# Patient Record
Sex: Male | Born: 1975
Health system: Southern US, Community
[De-identification: ages and names within clinical notes are randomized; demographics above are authoritative.]

## PROBLEM LIST (undated history)

## (undated) DIAGNOSIS — K649 Unspecified hemorrhoids: Secondary | ICD-10-CM

## (undated) DIAGNOSIS — Z8616 Personal history of COVID-19: Secondary | ICD-10-CM

## (undated) DIAGNOSIS — K219 Gastro-esophageal reflux disease without esophagitis: Secondary | ICD-10-CM

## (undated) DIAGNOSIS — Z9109 Other allergy status, other than to drugs and biological substances: Secondary | ICD-10-CM

## (undated) DIAGNOSIS — M19012 Primary osteoarthritis, left shoulder: Secondary | ICD-10-CM

## (undated) DIAGNOSIS — M25512 Pain in left shoulder: Secondary | ICD-10-CM

## (undated) DIAGNOSIS — Z9989 Dependence on other enabling machines and devices: Secondary | ICD-10-CM

## (undated) DIAGNOSIS — G4733 Obstructive sleep apnea (adult) (pediatric): Secondary | ICD-10-CM

## (undated) HISTORY — DX: Other allergy status, other than to drugs and biological substances: Z91.09

## (undated) HISTORY — DX: Gastro-esophageal reflux disease without esophagitis: K21.9

---

## 2006-01-22 ENCOUNTER — Ambulatory Visit: Payer: Self-pay | Admitting: Internal Medicine

## 2006-09-09 ENCOUNTER — Ambulatory Visit: Payer: Self-pay | Admitting: Internal Medicine

## 2008-01-21 ENCOUNTER — Emergency Department (HOSPITAL_COMMUNITY): Admission: EM | Admit: 2008-01-21 | Discharge: 2008-01-21 | Payer: Self-pay | Admitting: Emergency Medicine

## 2008-04-06 ENCOUNTER — Ambulatory Visit: Payer: Self-pay | Admitting: Internal Medicine

## 2008-04-06 DIAGNOSIS — K219 Gastro-esophageal reflux disease without esophagitis: Secondary | ICD-10-CM

## 2008-09-28 HISTORY — PX: ROTATOR CUFF REPAIR: SHX139

## 2009-02-05 ENCOUNTER — Telehealth (INDEPENDENT_AMBULATORY_CARE_PROVIDER_SITE_OTHER): Payer: Self-pay | Admitting: *Deleted

## 2009-02-05 ENCOUNTER — Ambulatory Visit: Payer: Self-pay | Admitting: Family Medicine

## 2009-02-08 ENCOUNTER — Telehealth (INDEPENDENT_AMBULATORY_CARE_PROVIDER_SITE_OTHER): Payer: Self-pay | Admitting: *Deleted

## 2009-02-11 ENCOUNTER — Encounter: Admission: RE | Admit: 2009-02-11 | Discharge: 2009-04-16 | Payer: Self-pay | Admitting: Orthopedic Surgery

## 2009-02-12 ENCOUNTER — Ambulatory Visit: Payer: Self-pay | Admitting: Internal Medicine

## 2009-10-09 ENCOUNTER — Encounter: Admission: RE | Admit: 2009-10-09 | Discharge: 2009-12-25 | Payer: Self-pay | Admitting: Orthopedic Surgery

## 2010-07-24 ENCOUNTER — Telehealth (INDEPENDENT_AMBULATORY_CARE_PROVIDER_SITE_OTHER): Payer: Self-pay | Admitting: *Deleted

## 2010-09-16 ENCOUNTER — Ambulatory Visit: Payer: Self-pay | Admitting: Internal Medicine

## 2010-09-16 DIAGNOSIS — R42 Dizziness and giddiness: Secondary | ICD-10-CM | POA: Insufficient documentation

## 2010-09-16 DIAGNOSIS — R21 Rash and other nonspecific skin eruption: Secondary | ICD-10-CM

## 2010-09-18 ENCOUNTER — Ambulatory Visit: Payer: Self-pay | Admitting: Internal Medicine

## 2010-09-24 LAB — CONVERTED CEMR LAB
BUN: 13 mg/dL (ref 6–23)
Basophils Relative: 0.5 % (ref 0.0–3.0)
CO2: 25 meq/L (ref 19–32)
Chloride: 106 meq/L (ref 96–112)
Cholesterol: 194 mg/dL (ref 0–200)
Eosinophils Absolute: 0.3 10*3/uL (ref 0.0–0.7)
Eosinophils Relative: 5.6 % — ABNORMAL HIGH (ref 0.0–5.0)
HCT: 46.2 % (ref 39.0–52.0)
LDL Cholesterol: 122 mg/dL — ABNORMAL HIGH (ref 0–99)
Lymphs Abs: 2.3 10*3/uL (ref 0.7–4.0)
MCHC: 34.1 g/dL (ref 30.0–36.0)
MCV: 89.5 fL (ref 78.0–100.0)
Monocytes Absolute: 0.7 10*3/uL (ref 0.1–1.0)
Platelets: 322 10*3/uL (ref 150.0–400.0)
Potassium: 4.4 meq/L (ref 3.5–5.1)
RBC: 5.16 M/uL (ref 4.22–5.81)
Triglycerides: 136 mg/dL (ref 0.0–149.0)
WBC: 5.9 10*3/uL (ref 4.5–10.5)

## 2010-10-28 NOTE — Progress Notes (Signed)
Summary: zantac refill quantity change request  Phone Note Refill Request Message from:  Fax from Pharmacy on July 24, 2010 3:56 PM  Refills Requested: Medication #1:  ZANTAC 150 MAXIMUM STRENGTH 150 MG  TABS 1 two times a day  ac meals [facility]   fax [phone]    (patient is employee of [employer])   handwritten note from pharmacy states   "[facility]'s ins covers OTC stock size pkg (#190) at no cost to pt.  Please advise on qty change.  Thanks! "  Initial call taken by: Jerolyn Shin,  July 24, 2010 3:58 PM    Prescriptions: ZANTAC 150 MAXIMUM STRENGTH 150 MG  TABS (RANITIDINE HCL) 1 two times a day  ac meals  #190 x 0   Entered by:   Doristine Devoid CMA   Authorized by:   Neena Rhymes MD   Signed by:   Doristine Devoid CMA on 07/24/2010   Method used:   Electronically to        [id number] (retail)       [address] [place], [place]  [address]       Ph: [phone]       Fax: [phone]   RxID:   [id number]

## 2010-10-30 NOTE — Assessment & Plan Note (Signed)
Summary: cpx, rash on face, heartburn///sph   Vital Signs:  Patient profile:   35 year old male Height:      66 inches Weight:      191.38 pounds BMI:     31.00 Pulse rate:   98 / minute Pulse rhythm:   regular BP sitting:   126 / 80  (left arm) Cuff size:   large  Vitals Entered By: Army Fossa CMA (September 16, 2010 2:01 PM) CC: CPX, not fasting  Comments c/o rash on back of neck- itches x 2 weeks  when bending over he gets dizzy. discuss tdap [employer]   History of Present Illness:  CPX, not fasting   in addition to his physical he needed to  discuss the following  2 weeks history of a rash in the sides of  the face, upper back, nuchal area , legs and suprapubic area. + pruritus. Denies exposures to new substances  when bending over he gets dizzy; symptoms   may last seconds to sometimes one or 2 days.  History of heartburn, currently taking Zantac as needed, it requires 2 or 3 Zantac over-the-counter to help with his symptoms. Denies dysphasia or odynophagia Sometimes has nausea and vomiting (describes regurgitation rather) in the mornings.       Current Medications (verified): 1)  Zantac 150 Maximum Strength 150 Mg  Tabs (Ranitidine Hcl) .Marland Kitchen.. 1 Two Times A Day  Ac Meals  Allergies (verified): No Known Drug Allergies  Past History:  Past Medical History: Reviewed history from 02/12/2009 and no changes required. no  Past Surgical History: Reviewed history from 02/12/2009 and no changes required. 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 CV:  Denies chest pain or discomfort and swelling of feet. Resp:  Denies cough, shortness of breath,  and wheezing. GI:  Denies bloody stools, diarrhea, and nausea. GU:  Denies dysuria, hematuria, urinary frequency, and urinary hesitancy. Psych:  Denies anxiety and depression.  Physical Exam  General:  alert, well-developed, and slightly overweight-appearing.   Neck:  no masses and no thyromegaly.   Lungs:  normal respiratory effort, no intercostal retractions, no accessory muscle use, and normal breath sounds.   Heart:  normal rate, regular rhythm, no murmur, and no gallop.   Abdomen:  soft, non-tender, no masses, no guarding, and no rigidity.   Extremities:  no pretibial edema bilaterally  Skin:  has a rash more noticeable at the upper back, nuchal area, some areas in the lower extremities. Rash is papular, slightly red and scaly, almost milliar Psych:  Oriented X3, memory intact for recent and remote, normally interactive, good eye contact, not anxious appearing, and not depressed appearing.     Impression & Recommendations:  Problem # 1:  ROUTINE GENERAL MEDICAL EXAM@HEALTH  CARE FACL (ICD-V70.0) apparently due for a tetanus shot, will recommend him to come back when better from the rash ( see physical exam)  had a flu shot 2 months ago  labs Diet exercise discussed  Problem # 2:  GERD (ICD-530.81) Assessment: Deteriorated has symptoms from time to time, would like something stronger than Zantac Plan: Switch to Prilosec GERD precautions provided His updated medication list for this problem includes:  Prilosec Otc 20 Mg Tbec (Omeprazole magnesium) ..... One by mouth before breakfast  Problem # 3:  RASH-NONVESICULAR (ICD-782.1) Assessment: New unclear etiology see Instructions  His updated medication list for this problem includes:    Hydrocortisone 2.5 % Lotn (Hydrocortisone) .Marland Kitchen... Applied 3 times a day for one week  Problem # 4:  DIZZINESS (ICD-780.4) Assessment: New dizziness, usually related with head motion recommend avoidance of quick head  movements, call if  worse states he would like to see a specialist if symptoms persist  His updated medication list for this problem includes:    Zyrtec Allergy 10 Mg Caps (Cetirizine hcl) .Marland Kitchen... 1 by mouth once daily as needed allergies  Complete Medication List: 1)  Prilosec Otc 20 Mg Tbec (Omeprazole magnesium) .... One by mouth before breakfast 2)  Hydrocortisone 2.5 % Lotn (Hydrocortisone) .... Applied 3 times a day for one week 3)  Zyrtec Allergy 10 Mg Caps (Cetirizine hcl) .Marland Kitchen.. 1 by mouth once daily as needed allergies  Patient Instructions: 1)  come back fasting 2)  FLP, BMP, CBC, AST, ALT, TSH---dx V70 3)  Stop Zantac, start Prilosec , one tablet daily, 20 minutes before breakfast 4)  for the rash, take Zyrtec 10 mg over the counter one a day, hydrocortisone lotion for a week to 10 days, call if no better 5)  Please schedule a follow-up appointment in 1 year.  Prescriptions: ZYRTEC ALLERGY 10 MG CAPS (CETIRIZINE HCL) 1 by mouth once daily as needed allergies  #30 x 3   Entered and Authorized by:   Nolon Rod. Paz MD   Signed by:   Nolon Rod. Paz MD on 09/16/2010   Method used:   Electronically to        [facility] (retail)       [address] [place], [place]  [address]       Ph: [phone]       Fax: [phone]   RxID:   [id number] PRILOSEC OTC 20 MG TBEC (OMEPRAZOLE MAGNESIUM) one by mouth before breakfast  #30 x 6   Entered and Authorized by:   Nolon Rod. Paz MD   Signed by:   Nolon Rod. Paz MD on 09/16/2010   Method used:   Electronically to        [facility] (retail)       [address] [place], [place]  [address]       Ph: [phone]       Fax: [phone]   RxID:   [id number] HYDROCORTISONE 2.5 % LOTN (HYDROCORTISONE) applied 3 times a day for one week  #120 cc x 0   Entered and Authorized by:   Nolon Rod. Paz MD   Signed by:   Nolon Rod. Paz MD on 09/16/2010   Method used:    Electronically to        [facility] (retail)       [address] [place], [place]  [address]       Ph: [phone]       Fax: [phone]   RxID:   [id number]    Orders Added: 1)  Est. Patient Level III [07371] 2)  Est. Patient age 23-39 [phone]

## 2011-04-03 ENCOUNTER — Encounter: Payer: Self-pay | Admitting: Internal Medicine

## 2011-04-03 ENCOUNTER — Ambulatory Visit (INDEPENDENT_AMBULATORY_CARE_PROVIDER_SITE_OTHER): Payer: Managed Care, Other (non HMO) | Admitting: Internal Medicine

## 2011-04-03 ENCOUNTER — Telehealth: Payer: Self-pay | Admitting: Internal Medicine

## 2011-04-03 VITALS — BP 128/80 | HR 100 | Temp 98.3°F | Wt 194.6 lb

## 2011-04-03 DIAGNOSIS — R21 Rash and other nonspecific skin eruption: Secondary | ICD-10-CM

## 2011-04-03 NOTE — Telephone Encounter (Signed)
error 

## 2011-04-03 NOTE — Progress Notes (Signed)
  Subjective:    Patient ID: Zachary Kerr, male    DOB: Sep 21, 1976, 35 y.o.   MRN: [id number]  HPI Rash for 3 days, mostly at the upper chest, upper back, nuchal area , arms, sides of the face  ++ pruritus This has been a recurrent problems, it usually self resolve in 2- 3 weeks.  No past medical history on file.  No past surgical history on file.  Review of Systems Zyrtec helps to some extent, no other family members affected    Objective:   Physical Exam Alert, no apparent distress, oriented x3. Skin: milliar , red rash , confluent in some areas at upper back=chest=nuchal area>arms Few between fingers in the R hand        Assessment & Plan:

## 2011-04-03 NOTE — Assessment & Plan Note (Addendum)
Recurrent-pruritic self resolving rash. Unclear etiology, could be contact dermatitis d/t  poison ivy but he has symptoms in the winter. Acarosis? Unlikely as  symptoms self-resolve. Plan: Zyrtec I like him to see dermatology ASAP so they can see the rash when he is at its worst.

## 2011-10-15 ENCOUNTER — Other Ambulatory Visit: Payer: Self-pay | Admitting: Internal Medicine

## 2013-01-24 ENCOUNTER — Other Ambulatory Visit: Payer: Self-pay | Admitting: Internal Medicine

## 2013-01-24 NOTE — Telephone Encounter (Signed)
Pt has not been seen within a year. OK to refill? 

## 2013-01-24 NOTE — Telephone Encounter (Signed)
Denied.

## 2013-04-18 ENCOUNTER — Encounter: Payer: Self-pay | Admitting: Internal Medicine

## 2013-04-18 ENCOUNTER — Ambulatory Visit (INDEPENDENT_AMBULATORY_CARE_PROVIDER_SITE_OTHER): Payer: Managed Care, Other (non HMO) | Admitting: Internal Medicine

## 2013-04-18 VITALS — BP 110/80 | HR 79 | Temp 98.0°F | Ht 64.5 in | Wt 193.2 lb

## 2013-04-18 DIAGNOSIS — Z0000 Encounter for general adult medical examination without abnormal findings: Secondary | ICD-10-CM | POA: Insufficient documentation

## 2013-04-18 DIAGNOSIS — K219 Gastro-esophageal reflux disease without esophagitis: Secondary | ICD-10-CM

## 2013-04-18 MED ORDER — DEXLANSOPRAZOLE 60 MG PO CPDR: 60.0000 mg | DELAYED_RELEASE_CAPSULE | Freq: Every day | ORAL | Status: DC

## 2013-04-18 NOTE — Patient Instructions (Addendum)
Please come back fasting: FLP, CBC, CMP, TSH MMR serology Hepatitis B surface antigen Anti hepatitis B core Hep A  Dx v70  ---     Dieta para el reflujo gastroesofgico - Adultos  (Diet for Gastroesophageal Reflux Disease, Adult)  El reflujo (reflujo cido) ocurre cuando el cido del estmago pasa al esfago. Cuando el cido entra en contacto con el esfago, el cido provoca dolor e irritacin (inflamacin) en el esfago. Cuando el reflujo ocurre a menudo o es tan grave que causa dao en el esfago, se denomina enfermedad por reflujo gastroesofgico (ERGE). La terapia nutricional puede ayudar a Acupuncturist de la [place].  ALIMENTOS O BEBIDAS QUE DEBE EVITAR O LIMITAR   Fumar o consumir tabaco. La nicotina es uno de los estimulantes ms potentes en la produccin de cido en el tracto gastrointestinal.  Caf y t negro con cafena o descafeinado.  Gaseosas comunes o bajas caloras o bebidas energizantes (las gaseosas sin cafena estn permitidas).   Especias picantes, como la pimienta negra, pimienta blanca, pimienta roja, pimienta de cayena, curry en polvo,y [place] en polvo.  Menta y mentol.  Chocolate.  Alimentos con alto contenido de grasas, incluyendo las carnes y comidas fritas. El agregado de [place] extra, por ejemplo aceite, [place], aderezo para ensaladas y nueces. Limite estos alimentos a menos de 8 cucharaditas por da.  Las frutas y verduras si no son toleradas, tales como frutas ctricas o tomates.  El alcohol.  Todo alimento que agrave el trastorno. Si tiene dudas relacionadas con la dieta, comunquese con el profesional que lo asiste o con un nutricionista matriculado.  OTROS FACTORES QUE PUEDEN ALIVIAR EL ERGE SON:   Comer lentamente, en un clima distendido.  Hacer 5 o 6 comidas pequeas por da en vez de tres grandes.  Suprimir por un [employer] alimentos que causen problemas.  No acostarse hasta despus de 3 horas de haber comido.  Mantener la  cabeza elevada 6 a 9 pulgadas (15 a 23 cm) usando una cua de espuma o bloques debajo de las patas de la cama. Si permanece en una postura plana har empeorar los sntomas.  Mantngase fsicamente activo. Perder peso puede ser de ayuda para reducir el [employer] adultos obesos o con sobrepeso.  Use ropas sueltas. EJEMPLO DE UN PLAN DE ALIMENTACIN  Este plan de alimentacin consiste en aproximadamente 2 000 caloras, segn las guas de alimentacin de https://www.bernard.org/.  Desayuno   taza de avena cocida.  1 porcin de fresas.  1 taza de [employer].  1 oz de almendras. Colacin  1 taza de rebanadas de pepino.  6 oz de yogur (elaborado con [employer] con bajo contenido de grasas o descremada). Almuerzo:  2 rebanada de pan integral.  2 oz de rebanadas de pavo.  2 cucharaditas de mayonesa.  1 taza de arndanos.  1 taza de guisantes. Colacin  6 crackers integrales.  1 oz ( 28 g) de queso en hebras. Cena   taza de arroz integral.  1 taza de vegetales variados.  1 cucharadita de aceite de oliva.  3 oz ( 84 g) de pescado grill. Document Released: 06/24/2005 Document Revised: 12/07/2011 [facility] Patient Information 2014 [place], [place].   Testicular Problems and Self-Exam Men can examine themselves easily and effectively with positive results. Monthly exams detect problems early and save lives. There are numerous causes of swelling in the testicle. Testicular cancer usually appears as a firm painless lump in the front part of the testicle. This may feel like  a dull ache or heavy feeling located in the lower abdomen (belly), groin, or scrotum.  The risk is greater in men with undescended testicles and it is more common in young men. It is responsible for almost a fifth of cancers in males between ages 14 and 38. Other common causes of swellings, lumps, and testicular pain include injuries, inflammation (soreness) from infection, hydrocele, and torsion. These are a  few of the reasons to do monthly self-examination of the testicles. The exam only takes minutes and could add years to your life. Get in the habit! SELF-EXAMINATION OF THE TESTICLES The testicles are easiest to examine after warm baths or showers and are more difficult to examine when you are cold. This is because the muscles attached to the testicles retract and pull them up higher or into the abdomen. While standing, roll one testicle between the thumb and forefinger. Feel for lumps, swelling, or discomfort. A normal testicle is egg shaped and feels firm. It is smooth and not tender. The spermatic cord can be felt as a firm spaghetti-like cord at the back of the testicle. It is also important to examine your groins. This is the crease between the front of your leg and your abdomen. Also, feel for enlarged lymph nodes (glands). Enlarged nodes are also a cause for you to see your caregiver for evaluation.  Self-examination of the testicles and groin areas on a regular basis will help you to know what your own testicles and groins feel like. This will help you pick up an abnormality (difference) at an earlier stage. Early discovery is the key to curing this cancer or treating other conditions. Any lump, change, or swelling in the testicle calls for immediate evaluation by your caregiver. Cancer of the testicle does not result in impotence and it does not prevent normal intercourse or prevent having children. If your caregiver feels that medical treatment or chemotherapy could lead to infertility, sperm can be frozen for future use. It is necessary to see a caregiver as soon as possible after the discovery of a lump in a testicle. Document Released: 12/21/2000 Document Revised: 12/07/2011 Document Reviewed: 09/15/2008 [facility] Patient Information 2014 [place], [place].

## 2013-04-18 NOTE — Progress Notes (Signed)
  Subjective:    Patient ID: Zachary Kerr, male    DOB: Apr 26, 1976, 37 y.o.   MRN: [id number]  HPI  CPX  Past Medical History  Diagnosis Date  . GERD (gastroesophageal reflux disease)   . Environmental allergies    Past Surgical History  Procedure Laterality Date  . Rotator cuff repair Left 2010   Family History  Problem Relation Age of Onset  . Alcohol abuse Father     GF  . Diabetes Neg Hx   . Hypertension Neg Hx   . Heart attack Neg Hx   . Colon cancer Neg Hx   . Prostate cancer Neg Hx    History   Social History  . Marital Status: Married    Spouse Name: N/A    Number of Children: 2  . Years of Education: N/A   Occupational History  . Forklift operator     Social History Main Topics  . Smoking status: Former Games developer  . Smokeless tobacco: Not on file  . Alcohol Use: No  . Drug Use: Not on file  . Sexually Active: Not on file   Other Topics Concern  . Not on file   Social History Narrative   From [place]           Review of Systems Diet is regular, he is active at work. History of GERD, Prilosec not helping enough, from time to time has severe acid reflux to the point that he needs to vomit, usually in the setting of eating late and going to bed. No odynophagia, very rarely has dysphagia. Denies weight loss, nausea, diarrhea or blood in the stools. No chest pain, shortness of breath. No dysuria gross hematuria.    Objective:   Physical Exam BP 110/80  Pulse 79  Temp(Src) 98 F (36.7 C) (Oral)  Ht 5' 4.5" (1.638 m)  Wt 193 lb 3.2 oz (87.635 kg)  BMI 32.66 kg/m2  SpO2 95%  General -- alert, well-developed, NAD .   Neck --no thyromegaly Lungs -- normal respiratory effort, no intercostal retractions, no accessory muscle use, and normal breath sounds.   Heart-- normal rate, regular rhythm, no murmur, and no gallop.   Abdomen--soft, non-tender, no distention, no masses, no HSM, no guarding, and no rigidity.   Extremities-- no pretibial edema  bilaterally Neurologic-- alert & oriented X3 and strength normal in all extremities. Psych-- Cognition and judgment appear intact. Alert and cooperative with normal attention span and concentration.  not anxious appearing and not depressed appearing.       Assessment & Plan:

## 2013-04-18 NOTE — Assessment & Plan Note (Addendum)
Long history of GERD, at some point well-controlled with Zantac,then  he required Prilosec and now Prilosec is not strong enough. Plan: Dexilant before breakfast Information about GERD precautions discussed specifically do not eat and lie  down immediately after. Patient also call me he is not improving in 1 month, he may need an EGD. Office visit 4 months.

## 2013-04-18 NOTE — Assessment & Plan Note (Addendum)
Td 2011 Patient request a number of immunizations as he will go through immigration and naturalization process; Many of today immunization he request are not age appropriate. Recommend to discuss with the doctor who will do his immigration physical exam. Will check hepatitis A and B. Serology, MMR titers. BMI, Diet and exercise discussed Labs

## 2013-04-19 ENCOUNTER — Encounter: Payer: Self-pay | Admitting: Internal Medicine

## 2013-04-19 ENCOUNTER — Other Ambulatory Visit (INDEPENDENT_AMBULATORY_CARE_PROVIDER_SITE_OTHER): Payer: Managed Care, Other (non HMO)

## 2013-04-19 DIAGNOSIS — Z0000 Encounter for general adult medical examination without abnormal findings: Secondary | ICD-10-CM

## 2013-04-19 LAB — LIPID PANEL
HDL: 42.2 mg/dL (ref >39.00)
Total CHOL/HDL Ratio: 5
Triglycerides: 212 mg/dL — ABNORMAL HIGH (ref 0.0–149.0)
VLDL: 42.4 mg/dL — ABNORMAL HIGH (ref 0.0–40.0)

## 2013-04-19 LAB — COMPREHENSIVE METABOLIC PANEL
AST: 26 U/L (ref 0–37)
Albumin: 4.1 g/dL (ref 3.5–5.2)
Alkaline Phosphatase: 52 U/L (ref 39–117)
BUN: 11 mg/dL (ref 6–23)
Calcium: 9.6 mg/dL (ref 8.4–10.5)
Chloride: 107 mEq/L (ref 96–112)
Creatinine, Ser: 0.9 mg/dL (ref 0.4–1.5)
Glucose, Bld: 84 mg/dL (ref 70–99)
Potassium: 4 mEq/L (ref 3.5–5.1)

## 2013-04-19 LAB — TSH: TSH: 0.79 u[IU]/mL (ref 0.35–5.50)

## 2013-04-19 LAB — CBC WITH DIFFERENTIAL/PLATELET
Eosinophils Relative: 3.8 % (ref 0.0–5.0)
Lymphocytes Relative: 37.5 % (ref 12.0–46.0)
Monocytes Absolute: 0.6 10*3/uL (ref 0.1–1.0)
Monocytes Relative: 11.1 % (ref 3.0–12.0)
Neutrophils Relative %: 47 % (ref 43.0–77.0)
Platelets: 319 10*3/uL (ref 150.0–400.0)
WBC: 5.6 10*3/uL (ref 4.5–10.5)

## 2013-04-20 LAB — RUBEOLA ANTIBODY IGG: Rubeola IgG: 32.3 AU/mL — ABNORMAL HIGH (ref <25.00)

## 2013-04-20 LAB — MUMPS ANTIBODY, IGG: Mumps IgG: 291 AU/mL — ABNORMAL HIGH (ref <9.00)

## 2013-04-24 ENCOUNTER — Encounter: Payer: Self-pay | Admitting: *Deleted

## 2013-05-02 ENCOUNTER — Ambulatory Visit (INDEPENDENT_AMBULATORY_CARE_PROVIDER_SITE_OTHER): Payer: Managed Care, Other (non HMO)

## 2013-05-02 DIAGNOSIS — Z23 Encounter for immunization: Secondary | ICD-10-CM

## 2013-06-20 ENCOUNTER — Ambulatory Visit: Payer: Managed Care, Other (non HMO) | Admitting: *Deleted

## 2013-06-20 DIAGNOSIS — Z23 Encounter for immunization: Secondary | ICD-10-CM

## 2013-10-10 ENCOUNTER — Ambulatory Visit: Payer: Managed Care, Other (non HMO) | Admitting: Internal Medicine

## 2013-10-10 ENCOUNTER — Ambulatory Visit (INDEPENDENT_AMBULATORY_CARE_PROVIDER_SITE_OTHER): Payer: Managed Care, Other (non HMO) | Admitting: Internal Medicine

## 2013-10-10 ENCOUNTER — Encounter: Payer: Self-pay | Admitting: Internal Medicine

## 2013-10-10 VITALS — BP 111/73 | HR 81 | Temp 98.0°F | Wt 205.0 lb

## 2013-10-10 DIAGNOSIS — Z0000 Encounter for general adult medical examination without abnormal findings: Secondary | ICD-10-CM

## 2013-10-10 DIAGNOSIS — Z23 Encounter for immunization: Secondary | ICD-10-CM

## 2013-10-10 DIAGNOSIS — J309 Allergic rhinitis, unspecified: Secondary | ICD-10-CM

## 2013-10-10 DIAGNOSIS — M7710 Lateral epicondylitis, unspecified elbow: Secondary | ICD-10-CM

## 2013-10-10 DIAGNOSIS — K219 Gastro-esophageal reflux disease without esophagitis: Secondary | ICD-10-CM

## 2013-10-10 MED ORDER — CETIRIZINE HCL 10 MG PO TABS: 10.0000 mg | ORAL_TABLET | Freq: Every day | ORAL | Status: DC

## 2013-10-10 MED ORDER — DEXLANSOPRAZOLE 60 MG PO CPDR: 60.0000 mg | DELAYED_RELEASE_CAPSULE | Freq: Every day | ORAL | Status: DC

## 2013-10-10 NOTE — Progress Notes (Signed)
Pre visit review using our clinic review tool, if applicable. No additional management support is needed unless otherwise documented below in the visit note. 

## 2013-10-10 NOTE — Progress Notes (Signed)
   Subjective:    Patient ID: Zachary Kerr, male    DOB: 04/09/1976, 38 y.o.   MRN: [id number]  HPI Here for a ROV ,we discussed the following issues: GERD--  symptoms are well-controlled with dexilant, wonders how long  He needs to take it. Tennis elbow--recently saw orthopedic Dr., he had a local injection, slightly better but is still has pain. Allergies--well-controlled with Zyrtec as needed, and needs a refill.  Past Medical History  Diagnosis Date  . GERD (gastroesophageal reflux disease)   . Environmental allergies    Past Surgical History  Procedure Laterality Date  . Rotator cuff repair Left 2010   History   Social History  . Marital Status: Married    Spouse Name: N/A    Number of Children: 2  . Years of Education: N/A   Occupational History  . Forklift operator     Social History Main Topics  . Smoking status: Former Games developermoker  . Smokeless tobacco: Not on file  . Alcohol Use: No  . Drug Use: Not on file  . Sexual Activity: Not on file   Other Topics Concern  . Not on file   Social History Narrative   From [place]            Review of Systems Denies nausea, vomiting, diarrhea. No dysphagia or odynophagia.     Objective:   Physical Exam BP 111/73  Pulse 81  Temp(Src) 98 F (36.7 C)  Wt 205 lb (92.987 kg)  SpO2 99% General -- alert, well-developed, NAD.   Extremities--  Left elbow normal Right elbow normal to inspection and palpation except for mild swelling without tenderness or redness in the lateral epicondyle  Neurologic--  alert & oriented X3. Speech normal, gait normal, strength normal in all extremities.  Psych-- Cognition and judgment appear intact. Cooperative with normal attention span and concentration. No anxious or depressed appearing.      Assessment & Plan:

## 2013-10-10 NOTE — Patient Instructions (Signed)
  Next visit is for a physical exam in 6 months , fasting Please make an appointment      

## 2013-10-10 NOTE — Assessment & Plan Note (Signed)
Request a refill on Zyrtec, uses prn

## 2013-10-10 NOTE — Assessment & Plan Note (Signed)
Symptoms well-controlled with dexilant, we agreed to decrease this to every other day for 3 months and then stop and take as needed.  He is following some of the GERD precautions I recommend him.

## 2013-10-10 NOTE — Assessment & Plan Note (Signed)
Status post two hepatitis a and B shots. Will get a hepatitis B shot today. One of the LFTs  was a slightly elevated, does not drink alcohol or takes Tylenol, we will recheck when he comes back for a CPX

## 2013-10-10 NOTE — Assessment & Plan Note (Signed)
Was seen by orthopedic surgery for a right tennis elbow, status post a local injection, still having some pain. Recommend consistent use of a tennis elbow support and  ICE

## 2014-04-24 ENCOUNTER — Encounter: Payer: Managed Care, Other (non HMO) | Admitting: Internal Medicine

## 2014-05-01 ENCOUNTER — Encounter: Payer: Self-pay | Admitting: Internal Medicine

## 2014-05-01 ENCOUNTER — Ambulatory Visit (INDEPENDENT_AMBULATORY_CARE_PROVIDER_SITE_OTHER): Payer: Managed Care, Other (non HMO) | Admitting: Internal Medicine

## 2014-05-01 VITALS — BP 101/66 | HR 70 | Temp 98.3°F | Ht 65.8 in | Wt 189.0 lb

## 2014-05-01 DIAGNOSIS — Z0000 Encounter for general adult medical examination without abnormal findings: Secondary | ICD-10-CM

## 2014-05-01 DIAGNOSIS — L259 Unspecified contact dermatitis, unspecified cause: Secondary | ICD-10-CM

## 2014-05-01 MED ORDER — PREDNISONE 10 MG PO TABS
ORAL_TABLET | ORAL | Status: DC
Start: 2014-05-01 — End: 2014-06-21

## 2014-05-01 MED ORDER — HYDROCORTISONE 2.5 % EX LOTN: TOPICAL_LOTION | Freq: Two times a day (BID) | CUTANEOUS | Status: DC | PRN

## 2014-05-01 NOTE — Progress Notes (Signed)
   Subjective:    Patient ID: Zachary Kerr, male    DOB: 12/31/1975, 38 y.o.   MRN: [id number]  DOS:  05/01/2014 Type of visit - description:  CPX Feels well    ROS Diet, Exercise-- much improved  No  CP, SOB Denies  nausea, vomiting diarrhea, blood in the stools No GERD  Sx recently .  (-) cough, sputum production (-) wheezing, chest congestion No dysuria, gross hematuria, difficulty urinating  No anxiety, depression    Past Medical History  Diagnosis Date  . GERD (gastroesophageal reflux disease)   . Environmental allergies     Past Surgical History  Procedure Laterality Date  . Rotator cuff repair Left 2010    History   Social History  . Marital Status: Married    Spouse Name: N/A    Number of Children: 2  . Years of Education: N/A   Occupational History  . Forklift operator     Social History Main Topics  . Smoking status: Former Games developermoker  . Smokeless tobacco: Never Used  . Alcohol Use: No  . Drug Use: No  . Sexual Activity: Not on file   Other Topics Concern  . Not on file   Social History Narrative   From [place]            Family History  Problem Relation Age of Onset  . Alcohol abuse Father     GF  . Diabetes Neg Hx   . Hypertension Neg Hx   . Heart attack Neg Hx   . Colon cancer Neg Hx   . Prostate cancer Neg Hx       Medication List       This list is accurate as of: 05/01/14  7:20 PM.  Always use your most recent med list.               cetirizine 10 MG tablet  Commonly known as:  ZYRTEC  Take 1 tablet (10 mg total) by mouth daily.     dexlansoprazole 60 MG capsule  Commonly known as:  DEXILANT  Take 1 capsule (60 mg total) by mouth daily.     hydrocortisone 2.5 % lotion  Apply topically 2 (two) times daily as needed.     predniSONE 10 MG tablet  Commonly known as:  DELTASONE  3 tabs x 3 days, 2 tabs x 3 days, 1 tab x 3 days           Objective:   Physical Exam BP 101/66  Pulse 70  Temp(Src) 98.3 F (36.8 C)   Ht 5' 5.8" (1.671 m)  Wt 189 lb (85.73 kg)  BMI 30.70 kg/m2  SpO2 96%  General -- alert, well-developed, NAD.  Neck --no thyromegaly , normal carotid pulse  HEENT-- Not pale.   Lungs -- normal respiratory effort, no intercostal retractions, no accessory muscle use, and normal breath sounds.  Heart-- normal rate, regular rhythm, no murmur.  Abdomen-- Not distended, good bowel sounds,soft, non-tender. Extremities-- no pretibial edema bilaterally  Neurologic--  alert & oriented X3. Speech normal, gait appropriate for age, strength symmetric and appropriate for age.  Psych-- Cognition and judgment appear intact. Cooperative with normal attention span and concentration. No anxious or depressed appearing.         Assessment & Plan:

## 2014-05-01 NOTE — Progress Notes (Signed)
Pre visit review using our clinic review tool, if applicable. No additional management support is needed unless otherwise documented below in the visit note. 

## 2014-05-01 NOTE — Assessment & Plan Note (Addendum)
Td 2011  diet- exercise-- doing better Labs  ekg nsr

## 2014-05-01 NOTE — Patient Instructions (Signed)
Come back fasting for blood work CMP, FLP, CBC------------ dx V70  If you have an allergic reaction to poison oak: Take prednisone as prescribed for few days Use a prescription for hydrocortisone 2.5% twice a day until better Take OTC Zyrtec or Claritin as needed Call if no better    Next visit 1 year

## 2014-05-01 NOTE — Assessment & Plan Note (Signed)
H/o contact dermatitis due to plants, request a prescription to have in case he needs it. Recommend first avoidance; if he does develop a rash will take prednisone and use hydrocortisone 2.5%, prescriptions provided

## 2014-05-04 ENCOUNTER — Other Ambulatory Visit (INDEPENDENT_AMBULATORY_CARE_PROVIDER_SITE_OTHER): Payer: Managed Care, Other (non HMO)

## 2014-05-04 DIAGNOSIS — Z0000 Encounter for general adult medical examination without abnormal findings: Secondary | ICD-10-CM

## 2014-05-04 LAB — COMPREHENSIVE METABOLIC PANEL
ALBUMIN: 3.8 g/dL (ref 3.5–5.2)
ALT: 43 U/L (ref 0–53)
AST: 23 U/L (ref 0–37)
Alkaline Phosphatase: 50 U/L (ref 39–117)
BUN: 14 mg/dL (ref 6–23)
CALCIUM: 9.1 mg/dL (ref 8.4–10.5)
CO2: 22 meq/L (ref 19–32)
Chloride: 108 mEq/L (ref 96–112)
Creatinine, Ser: 0.8 mg/dL (ref 0.4–1.5)
GFR: 107.43 mL/min (ref >60.00)
GLUCOSE: 86 mg/dL (ref 70–99)
POTASSIUM: 3.8 meq/L (ref 3.5–5.1)
Sodium: 138 mEq/L (ref 135–145)
TOTAL PROTEIN: 6.8 g/dL (ref 6.0–8.3)
Total Bilirubin: 0.8 mg/dL (ref 0.2–1.2)

## 2014-05-04 LAB — CBC WITH DIFFERENTIAL/PLATELET
BASOS ABS: 0 10*3/uL (ref 0.0–0.1)
Basophils Relative: 0.5 % (ref 0.0–3.0)
Eosinophils Absolute: 0.2 10*3/uL (ref 0.0–0.7)
Eosinophils Relative: 3.8 % (ref 0.0–5.0)
HCT: 44.1 % (ref 39.0–52.0)
Hemoglobin: 15.1 g/dL (ref 13.0–17.0)
LYMPHS PCT: 37.4 % (ref 12.0–46.0)
Lymphs Abs: 2 10*3/uL (ref 0.7–4.0)
MCHC: 34.2 g/dL (ref 30.0–36.0)
MCV: 88.1 fl (ref 78.0–100.0)
Monocytes Absolute: 0.6 10*3/uL (ref 0.1–1.0)
Monocytes Relative: 11.9 % (ref 3.0–12.0)
NEUTROS PCT: 46.4 % (ref 43.0–77.0)
Neutro Abs: 2.5 10*3/uL (ref 1.4–7.7)
Platelets: 292 10*3/uL (ref 150.0–400.0)
RBC: 5.01 Mil/uL (ref 4.22–5.81)
RDW: 13.2 % (ref 11.5–15.5)
WBC: 5.3 10*3/uL (ref 4.0–10.5)

## 2014-05-04 LAB — LIPID PANEL
CHOL/HDL RATIO: 4
Cholesterol: 186 mg/dL (ref 0–200)
HDL: 44.6 mg/dL (ref >39.00)
LDL CALC: 117 mg/dL — AB (ref 0–99)
NONHDL: 141.4
TRIGLYCERIDES: 121 mg/dL (ref 0.0–149.0)
VLDL: 24.2 mg/dL (ref 0.0–40.0)

## 2014-06-21 ENCOUNTER — Encounter: Payer: Self-pay | Admitting: Physician Assistant

## 2014-06-21 ENCOUNTER — Emergency Department (HOSPITAL_BASED_OUTPATIENT_CLINIC_OR_DEPARTMENT_OTHER)
Admission: EM | Admit: 2014-06-21 | Discharge: 2014-06-21 | Discharge status: Against Medical Advice | Payer: Managed Care, Other (non HMO) | Attending: Emergency Medicine | Admitting: Emergency Medicine

## 2014-06-21 ENCOUNTER — Ambulatory Visit (INDEPENDENT_AMBULATORY_CARE_PROVIDER_SITE_OTHER): Payer: Managed Care, Other (non HMO) | Admitting: Physician Assistant

## 2014-06-21 ENCOUNTER — Encounter (HOSPITAL_BASED_OUTPATIENT_CLINIC_OR_DEPARTMENT_OTHER): Payer: Self-pay | Admitting: Emergency Medicine

## 2014-06-21 VITALS — BP 106/73 | HR 88 | Temp 98.0°F | Resp 18 | Ht 65.8 in | Wt 181.4 lb

## 2014-06-21 DIAGNOSIS — R197 Diarrhea, unspecified: Secondary | ICD-10-CM | POA: Insufficient documentation

## 2014-06-21 DIAGNOSIS — K5289 Other specified noninfective gastroenteritis and colitis: Secondary | ICD-10-CM

## 2014-06-21 DIAGNOSIS — A049 Bacterial intestinal infection, unspecified: Secondary | ICD-10-CM | POA: Insufficient documentation

## 2014-06-21 LAB — CBC WITH DIFFERENTIAL/PLATELET
Basophils Absolute: 0 10*3/uL (ref 0.0–0.1)
Basophils Relative: 0.2 % (ref 0.0–3.0)
Eosinophils Absolute: 0.1 10*3/uL (ref 0.0–0.7)
Eosinophils Relative: 1 % (ref 0.0–5.0)
HCT: 48.9 % (ref 39.0–52.0)
Hemoglobin: 17.3 g/dL — ABNORMAL HIGH (ref 13.0–17.0)
Lymphocytes Relative: 17.5 % (ref 12.0–46.0)
Lymphs Abs: 0.8 10*3/uL (ref 0.7–4.0)
MCHC: 35.4 g/dL (ref 30.0–36.0)
MCV: 86 fl (ref 78.0–100.0)
Monocytes Absolute: 0.6 10*3/uL (ref 0.1–1.0)
Monocytes Relative: 12.7 % — ABNORMAL HIGH (ref 3.0–12.0)
Neutro Abs: 3.3 10*3/uL (ref 1.4–7.7)
Neutrophils Relative %: 68.6 % (ref 43.0–77.0)
Platelets: 325 10*3/uL (ref 150.0–400.0)
RBC: 5.69 Mil/uL (ref 4.22–5.81)
RDW: 13.2 % (ref 11.5–15.5)
WBC: 4.9 10*3/uL (ref 4.0–10.5)

## 2014-06-21 LAB — BASIC METABOLIC PANEL
BUN: 34 mg/dL — ABNORMAL HIGH (ref 6–23)
CO2: 21 mEq/L (ref 19–32)
Calcium: 9.2 mg/dL (ref 8.4–10.5)
Chloride: 97 mEq/L (ref 96–112)
Creatinine, Ser: 2.3 mg/dL — ABNORMAL HIGH (ref 0.4–1.5)
GFR: 34.09 mL/min — ABNORMAL LOW (ref >60.00)
Glucose, Bld: 113 mg/dL — ABNORMAL HIGH (ref 70–99)
Potassium: 3.3 mEq/L — ABNORMAL LOW (ref 3.5–5.1)
Sodium: 130 mEq/L — ABNORMAL LOW (ref 135–145)

## 2014-06-21 MED ORDER — CIPROFLOXACIN HCL 500 MG PO TABS: 500.0000 mg | ORAL_TABLET | Freq: Two times a day (BID) | ORAL | Status: DC

## 2014-06-21 NOTE — Progress Notes (Signed)
Pre visit review using our clinic review tool, if applicable. No additional management support is needed unless otherwise documented below in the visit note. 

## 2014-06-21 NOTE — Progress Notes (Signed)
Patient presents to clinic today c/o frequent loose stools over the past 4 days after ingesting some undercooked chicken. Endorses intermittent fevers at home, although afebrile at present.  Endorses diffuse abdominal cramping.  Denies focal pain, tenesmus, melena or hematochezia.  Denies recent travel or sick contact.  Is able to tolerate PO fluids without nausea or emesis.  Was seen at an UC a few days ago and diagnosed with viral infection.  Denies having labs or stool cultures at that time.  Past Medical History  Diagnosis Date  . GERD (gastroesophageal reflux disease)   . Environmental allergies     Current Outpatient Prescriptions on File Prior to Visit  Medication Sig Dispense Refill  . cetirizine (ZYRTEC) 10 MG tablet Take 1 tablet (10 mg total) by mouth daily.  30 tablet  6  . dexlansoprazole (DEXILANT) 60 MG capsule Take 1 capsule (60 mg total) by mouth daily.  30 capsule  6   No current facility-administered medications on file prior to visit.    No Known Allergies  Family History  Problem Relation Age of Onset  . Alcohol abuse Father     GF  . Diabetes Neg Hx   . Hypertension Neg Hx   . Heart attack Neg Hx   . Colon cancer Neg Hx   . Prostate cancer Neg Hx     History   Social History  . Marital Status: Married    Spouse Name: N/A    Number of Children: 2  . Years of Education: N/A   Occupational History  . Forklift operator     Social History Main Topics  . Smoking status: Former Games developer  . Smokeless tobacco: Never Used  . Alcohol Use: No  . Drug Use: No  . Sexual Activity: None   Other Topics Concern  . None   Social History Narrative   From [place]          Review of Systems - See HPI.  All other ROS are negative.  BP 106/73  Pulse 88  Temp(Src) 98 F (36.7 C) (Oral)  Resp 18  Ht 5' 5.8" (1.671 m)  Wt 181 lb 6.4 oz (82.283 kg)  BMI 29.47 kg/m2  SpO2 97%  Physical Exam  Vitals reviewed. Constitutional: He is oriented to person,  place, and time and well-developed, well-nourished, and in no distress.  HENT:  Head: Normocephalic and atraumatic.  Mouth/Throat: Oropharynx is clear and moist.  Eyes: Conjunctivae are normal. Pupils are equal, round, and reactive to light.  Neck: Neck supple.  Cardiovascular: Normal rate, regular rhythm, normal heart sounds and intact distal pulses.   Pulmonary/Chest: Effort normal and breath sounds normal. No respiratory distress. He has no wheezes. He has no rales. He exhibits no tenderness.  Abdominal: Soft. He exhibits no distension. There is no rebound and no guarding.  Diffuse abdominal tenderness.  Bowel sounds hyperactive in all 4 Q's  Neurological: He is alert and oriented to person, place, and time.  Skin: Skin is warm and dry. No rash noted.  Psychiatric: Affect normal.   Recent Results (from the past 2160 hour(s))  COMPREHENSIVE METABOLIC PANEL     Status: None   Collection Time    05/04/14  8:15 AM      Result Value Ref Range   Sodium 138  135 - 145 mEq/L   Potassium 3.8  3.5 - 5.1 mEq/L   Chloride 108  96 - 112 mEq/L   CO2 22  19 - 32 mEq/L  Glucose, Bld 86  70 - 99 mg/dL   BUN 14  6 - 23 mg/dL   Creatinine, Ser 0.8  0.4 - 1.5 mg/dL   Total Bilirubin 0.8  0.2 - 1.2 mg/dL   Alkaline Phosphatase 50  39 - 117 U/L   AST 23  0 - 37 U/L   ALT 43  0 - 53 U/L   Total Protein 6.8  6.0 - 8.3 g/dL   Albumin 3.8  3.5 - 5.2 g/dL   Calcium 9.1  8.4 - 09.8 mg/dL   GFR 119.14  >78.29 mL/min  LIPID PANEL     Status: Abnormal   Collection Time    05/04/14  8:15 AM      Result Value Ref Range   Cholesterol 186  0 - 200 mg/dL   Comment: ATP III Classification       Desirable:  < 200 mg/dL               Borderline High:  200 - 239 mg/dL          High:  > = 562 mg/dL   Triglycerides 130.8  0.0 - 149.0 mg/dL   Comment: Normal:  <657 mg/dLBorderline High:  150 - 199 mg/dL   HDL 84.69  >62.95 mg/dL   VLDL 28.4  0.0 - 13.2 mg/dL   LDL Cholesterol 440 (*) 0 - 99 mg/dL   Total  CHOL/HDL Ratio 4     Comment:                Men          Women1/2 Average Risk     3.4          3.3Average Risk          5.0          4.42X Average Risk          9.6          7.13X Average Risk          15.0          11.0                       NonHDL 141.40     Comment: NOTE:  Non-HDL goal should be 30 mg/dL higher than patient's LDL goal (i.e. LDL goal of < 70 mg/dL, would have non-HDL goal of < 100 mg/dL)  CBC WITH DIFFERENTIAL     Status: None   Collection Time    05/04/14  8:15 AM      Result Value Ref Range   WBC 5.3  4.0 - 10.5 K/uL   RBC 5.01  4.22 - 5.81 Mil/uL   Hemoglobin 15.1  13.0 - 17.0 g/dL   HCT 10.2  72.5 - 36.6 %   MCV 88.1  78.0 - 100.0 fl   MCHC 34.2  30.0 - 36.0 g/dL   RDW 44.0  34.7 - 42.5 %   Platelets 292.0  150.0 - 400.0 K/uL   Neutrophils Relative % 46.4  43.0 - 77.0 %   Lymphocytes Relative 37.4  12.0 - 46.0 %   Monocytes Relative 11.9  3.0 - 12.0 %   Eosinophils Relative 3.8  0.0 - 5.0 %   Basophils Relative 0.5  0.0 - 3.0 %   Neutro Abs 2.5  1.4 - 7.7 K/uL   Lymphs Abs 2.0  0.7 - 4.0 K/uL   Monocytes Absolute 0.6  0.1 - 1.0 K/uL  Eosinophils Absolute 0.2  0.0 - 0.7 K/uL   Basophils Absolute 0.0  0.0 - 0.1 K/uL   Assessment/Plan: Bacterial gastroenteritis Suspected salmenellosis giving history and intermittent fever.  Will obtain CBC, BMP, Stool Cultue, Ova and Parasites and C. Diff assay.  Will empirically treat with Cipro BID x 5 days.  Increase fluids.  Encouraged Gatorade.  Diet for diarrhea given to patient.  Avoid antidiarrheals to help remove toxins from body.  If stools very frequent, can try some Immodium.  Alarm signs/symptoms discussed with patient.  Patient is aware when to proceed to ER if indicated.  Otherwise follow-up with PCP on Monday.

## 2014-06-21 NOTE — ED Notes (Signed)
Pt c/o HA and diarrhea that began Monday morning. Pt denies N/V. Pt also sts he began having abdominal pain Monday afternoon. Pt seen at [facility] yesterday but he sts "they couldn't find anything." He sts she gave him an RX to stop the diarrhea and told him to go back to work today. He sts he does not know the name of the med but that they aren't working.

## 2014-06-21 NOTE — Patient Instructions (Addendum)
Please go to lab for blood work.  I will call you with your results.  You will be given items to help collect stool for testing. Please bring those in if symptoms are persisting despite treatment.  I feel your symptoms are bacterial related giving you possibly had some undercooked chicken the night before symptoms started.  Please take Ciprofloxacin twice daily for 5 days.  Stay well hydrated.  Consider drinking some gatorade.  Please read diet for diarrhea below.  If this is bacterial then the diarrhea helps remove the  Toxins from the body, so having some bowel movements is beneficial.  Only take Lomotil if bowel movements are very frequent.  Can use tylenol for abdominal discomfort.  If symptoms acutely worsen or you are unable to tolerate fluids by mouth, please go directly to the ER.  Follow-up with Dr. Drue Novel on Monday.  Food Choices to Help Relieve Diarrhea When you have diarrhea, the foods you eat and your eating habits are very important. Choosing the right foods and drinks can help relieve diarrhea. Also, because diarrhea can last up to 7 days, you need to replace lost fluids and electrolytes (such as sodium, potassium, and chloride) in order to help prevent dehydration.  WHAT GENERAL GUIDELINES DO I NEED TO FOLLOW?  Slowly drink 1 cup (8 oz) of fluid for each episode of diarrhea. If you are getting enough fluid, your urine will be clear or pale yellow.  Eat starchy foods. Some good choices include white rice, white toast, pasta, low-fiber cereal, baked potatoes (without the skin), saltine crackers, and bagels.  Avoid large servings of any cooked vegetables.  Limit fruit to two servings per day. A serving is  cup or 1 small piece.  Choose foods with less than 2 g of fiber per serving.  Limit fats to less than 8 tsp (38 g) per day.  Avoid fried foods.  Eat foods that have probiotics in them. Probiotics can be found in certain dairy products.  Avoid foods and beverages that may increase  the speed at which food moves through the stomach and intestines (gastrointestinal tract). Things to avoid include:  High-fiber foods, such as dried fruit, raw fruits and vegetables, nuts, seeds, and whole grain foods.  Spicy foods and high-fat foods.  Foods and beverages sweetened with high-fructose corn syrup, honey, or sugar alcohols such as xylitol, sorbitol, and mannitol. WHAT FOODS ARE RECOMMENDED? Grains White rice. White, [place], or pita breads (fresh or toasted), including plain rolls, buns, or bagels. White pasta. Saltine, soda, or graham crackers. Pretzels. Low-fiber cereal. Cooked cereals made with water (such as cornmeal, farina, or cream cereals). Plain muffins. Matzo. Melba toast. Zwieback.  Vegetables Potatoes (without the skin). Strained tomato and vegetable juices. Most well-cooked and canned vegetables without seeds. Tender lettuce. Fruits Cooked or canned applesauce, apricots, cherries, fruit cocktail, grapefruit, peaches, pears, or plums. Fresh bananas, apples without skin, cherries, grapes, cantaloupe, grapefruit, peaches, oranges, or plums.  Meat and Other Protein Products Baked or boiled chicken. Eggs. Tofu. Fish. Seafood. Smooth peanut butter. Ground or well-cooked tender beef, ham, veal, lamb, pork, or poultry.  Dairy Plain yogurt, kefir, and unsweetened liquid yogurt. Lactose-free milk, buttermilk, or soy milk. Plain hard cheese. Beverages Sport drinks. Clear broths. Diluted fruit juices (except prune). Regular, caffeine-free sodas such as ginger ale. Water. Decaffeinated teas. Oral rehydration solutions. Sugar-free beverages not sweetened with sugar alcohols. Other Bouillon, broth, or soups made from recommended foods.  The items listed above may not be a complete list  of recommended foods or beverages. Contact your dietitian for more options. WHAT FOODS ARE NOT RECOMMENDED? Grains Whole grain, whole wheat, bran, or rye breads, rolls, pastas, crackers, and  cereals. Wild or brown rice. Cereals that contain more than 2 g of fiber per serving. Corn tortillas or taco shells. Cooked or dry oatmeal. Granola. Popcorn. Vegetables Raw vegetables. Cabbage, broccoli, Brussels sprouts, artichokes, baked beans, beet greens, corn, kale, legumes, peas, sweet potatoes, and yams. Potato skins. Cooked spinach and cabbage. Fruits Dried fruit, including raisins and dates. Raw fruits. Stewed or dried prunes. Fresh apples with skin, apricots, mangoes, pears, raspberries, and strawberries.  Meat and Other Protein Products Chunky peanut butter. Nuts and seeds. Beans and lentils. Tomasa Blase.  Dairy High-fat cheeses. Milk, chocolate milk, and beverages made with milk, such as milk shakes. Cream. Ice cream. Sweets and Desserts Sweet rolls, doughnuts, and sweet breads. Pancakes and waffles. Fats and Oils Butter. Cream sauces. Margarine. Salad oils. Plain salad dressings. Olives. Avocados.  Beverages Caffeinated beverages (such as coffee, tea, soda, or energy drinks). Alcoholic beverages. Fruit juices with pulp. Prune juice. Soft drinks sweetened with high-fructose corn syrup or sugar alcohols. Other Coconut. Hot sauce. Chili powder. Mayonnaise. Gravy. Cream-based or milk-based soups.  The items listed above may not be a complete list of foods and beverages to avoid. Contact your dietitian for more information. WHAT SHOULD I DO IF I BECOME DEHYDRATED? Diarrhea can sometimes lead to dehydration. Signs of dehydration include dark urine and dry mouth and skin. If you think you are dehydrated, you should rehydrate with an oral rehydration solution. These solutions can be purchased at pharmacies, retail stores, or online.  Drink -1 cup (120-240 mL) of oral rehydration solution each time you have an episode of diarrhea. If drinking this amount makes your diarrhea worse, try drinking smaller amounts more often. For example, drink 1-3 tsp (5-15 mL) every 5-10 minutes.  A general rule for  staying hydrated is to drink 1-2 L of fluid per day. Talk to your health care provider about the specific amount you should be drinking each day. Drink enough fluids to keep your urine clear or pale yellow. Document Released: 12/05/2003 Document Revised: 09/19/2013 Document Reviewed: 08/07/2013 [facility] Patient Information 2015 [place], [place]. This information is not intended to replace advice given to you by your health care provider. Make sure you discuss any questions you have with your health care provider.

## 2014-06-21 NOTE — ED Notes (Signed)
In the middle of triaging patient, he made comments that indicated that he was supposed to be upstairs at the [facility] clinic and not the ER. Mistake explained to pt. I advised him that we would be happy to see him if the clinic could not fit him in and he stated that he would return if need be. Pt got himself dressed and I escorted him up to the second floor to the clinic

## 2014-06-21 NOTE — Assessment & Plan Note (Signed)
Suspected salmenellosis giving history and intermittent fever.  Will obtain CBC, BMP, Stool Cultue, Ova and Parasites and C. Diff assay.  Will empirically treat with Cipro BID x 5 days.  Increase fluids.  Encouraged Gatorade.  Diet for diarrhea given to patient.  Avoid antidiarrheals to help remove toxins from body.  If stools very frequent, can try some Immodium.  Alarm signs/symptoms discussed with patient.  Patient is aware when to proceed to ER if indicated.  Otherwise follow-up with PCP on Monday.

## 2014-06-23 NOTE — ED Provider Notes (Signed)
Registered by mistake.Patient did not want to be seen by me  Nelia Shi, MD 06/23/14 2148

## 2014-08-08 ENCOUNTER — Other Ambulatory Visit: Payer: Self-pay | Admitting: Internal Medicine

## 2014-11-30 ENCOUNTER — Encounter: Payer: Self-pay | Admitting: Medical

## 2014-11-30 ENCOUNTER — Ambulatory Visit (INDEPENDENT_AMBULATORY_CARE_PROVIDER_SITE_OTHER): Payer: Managed Care, Other (non HMO) | Admitting: Medical

## 2014-11-30 VITALS — BP 125/84 | HR 113 | Temp 99.3°F | Ht 65.8 in | Wt 198.4 lb

## 2014-11-30 DIAGNOSIS — B349 Viral infection, unspecified: Secondary | ICD-10-CM | POA: Diagnosis not present

## 2014-11-30 DIAGNOSIS — R52 Pain, unspecified: Secondary | ICD-10-CM | POA: Diagnosis not present

## 2014-11-30 LAB — POCT INFLUENZA A/B
Influenza A, POC: NEGATIVE
Influenza B, POC: NEGATIVE

## 2014-11-30 MED ORDER — AZITHROMYCIN 250 MG PO TABS: ORAL_TABLET | ORAL | Status: DC

## 2014-11-30 MED ORDER — OSELTAMIVIR PHOSPHATE 75 MG PO CAPS: 75.0000 mg | ORAL_CAPSULE | Freq: Two times a day (BID) | ORAL | Status: DC

## 2014-11-30 NOTE — Patient Instructions (Signed)
Viral syndrome You appear to have the flu even though your  rapid flu test was negative.(Important to note sometimes flu test can be falsely negative.) Therefore,  I am treating you with tamiflu based on your clinical presentation. Rest, hydrate and take tylenol for fever. Alternate ibuprofen  if necessary for body ache or fever. You should gradually improve but  Could develop secondary bacterial infections.  Your left ear is already red. If you have ear pain, increasing st, sinus pressure or bronchitis type symptoms then start azithromycin.       Follow up in 7 days or as needed

## 2014-11-30 NOTE — Progress Notes (Signed)
Subjective:    Patient ID: Zachary Kerr, male    DOB: Nov 14, 1975, 39 y.o.   MRN: [id number]  HPI   Pt states acute onset of some st last night. Body aches, fevers and chills. Pt daughter had the flu. This week was sick. Pt daughter tested + just recently.     Review of Systems  Constitutional: Positive for fever and chills.  HENT: Positive for sore throat.   Respiratory: Positive for cough. Negative for chest tightness, shortness of breath and wheezing.   Cardiovascular: Negative for chest pain and palpitations.  Gastrointestinal: Negative for vomiting, abdominal pain and constipation.  Musculoskeletal: Positive for myalgias.  Neurological: Positive for headaches. Negative for dizziness, tremors, syncope, facial asymmetry, light-headedness and numbness.  Hematological: Negative for adenopathy. Does not bruise/bleed easily.   Past Medical History  Diagnosis Date  . GERD (gastroesophageal reflux disease)   . Environmental allergies     History   Social History  . Marital Status: Married    Spouse Name: N/A  . Number of Children: 2  . Years of Education: N/A   Occupational History  . Forklift operator     Social History Main Topics  . Smoking status: Former Games developer  . Smokeless tobacco: Never Used  . Alcohol Use: No  . Drug Use: No  . Sexual Activity: Not on file   Other Topics Concern  . Not on file   Social History Narrative   [place]           Past Surgical History  Procedure Laterality Date  . Rotator cuff repair Left 2010    Family History  Problem Relation Age of Onset  . Alcohol abuse Father     GF  . Diabetes Neg Hx   . Hypertension Neg Hx   . Heart attack Neg Hx   . Colon cancer Neg Hx   . Prostate cancer Neg Hx     No Known Allergies  Current Outpatient Prescriptions on File Prior to Visit  Medication Sig Dispense Refill  . cetirizine (ZYRTEC) 10 MG tablet Take 1 tablet (10 mg total) by mouth daily. 30 tablet 6  .  dexlansoprazole (DEXILANT) 60 MG capsule Take 1 capsule (60 mg total) by mouth daily. 30 capsule 6   No current facility-administered medications on file prior to visit.    BP 125/84 mmHg  Pulse 113  Temp(Src) 99.3 F (37.4 C) (Oral)  Ht 5' 5.8" (1.671 m)  Wt 198 lb 6.4 oz (89.994 kg)  BMI 32.23 kg/m2  SpO2 99%       Objective:   Physical Exam  General  Mental Status - Alert. General Appearance - Well groomed. Not in acute distress.  Skin Rashes- No Rashes.  HEENT Head- Normal. Ear Auditory Canal - Left- Normal. Right - Normal.Tympanic Membrane- Left- mild red. Right- Normal. Eye Sclera/Conjunctiva- Left- Normal. Right- Normal. Nose & Sinuses Nasal Mucosa- Left-  Mild boggy + Congested. Right-  boggy + Congested.no sinus pressure Mouth & Throat Lips: Upper Lip- Normal: no dryness, cracking, pallor, cyanosis, or vesicular eruption. Lower Lip-Normal: no dryness, cracking, pallor, cyanosis or vesicular eruption. Buccal Mucosa- Bilateral- No Aphthous ulcers. Oropharynx- No Discharge or Erythema. Tonsils: Characteristics- Bilateral- faint  Erythema + Congestion. Size/Enlargement- Bilateral- 1+ enlargement. Discharge- bilateral-None.  Neck Neck- Supple. No Masses. No lymphadenopathy.   Chest and Lung Exam Auscultation: Breath Sounds:- even and unlabored,  Cardiovascular Auscultation:Rythm- Regular, rate and rhythm. Murmurs & Other Heart Sounds:Ausculatation of the heart reveal- No  Murmurs.  Lymphatic Head & Neck General Head & Neck Lymphatics: Bilateral: Description- No Localized lymphadenopathy.       Assessment & Plan:

## 2014-11-30 NOTE — Assessment & Plan Note (Signed)
You appear to have the flu even though your  rapid flu test was negative.(Important to note sometimes flu test can be falsely negative.) Therefore,  I am treating you with tamiflu based on your clinical presentation. Rest, hydrate and take tylenol for fever. Alternate ibuprofen  if necessary for body ache or fever. You should gradually improve but  Could develop secondary bacterial infections.  Your left ear is already red. If you have ear pain, increasing st, sinus pressure or bronchitis type symptoms then start azithromycin.

## 2014-11-30 NOTE — Progress Notes (Signed)
Pre visit review using our clinic review tool, if applicable. No additional management support is needed unless otherwise documented below in the visit note. 

## 2015-05-17 ENCOUNTER — Other Ambulatory Visit: Payer: Self-pay

## 2015-05-17 ENCOUNTER — Other Ambulatory Visit: Payer: Self-pay | Admitting: Internal Medicine

## 2016-12-03 ENCOUNTER — Telehealth: Payer: Self-pay | Admitting: Internal Medicine

## 2016-12-03 ENCOUNTER — Encounter: Payer: Self-pay | Admitting: Internal Medicine

## 2016-12-03 ENCOUNTER — Ambulatory Visit: Payer: Managed Care, Other (non HMO) | Admitting: Internal Medicine

## 2016-12-03 NOTE — Telephone Encounter (Signed)
Pt lvm at 8:11 to cancel his appt. He says that he is going to see a different provider.     Should pt be charged.

## 2016-12-03 NOTE — Telephone Encounter (Signed)
Yes please

## 2016-12-31 ENCOUNTER — Ambulatory Visit (INDEPENDENT_AMBULATORY_CARE_PROVIDER_SITE_OTHER): Payer: Managed Care, Other (non HMO) | Admitting: Medical

## 2016-12-31 ENCOUNTER — Encounter: Payer: Self-pay | Admitting: Medical

## 2016-12-31 VITALS — BP 119/82 | HR 95 | Temp 98.9°F | Resp 18 | Wt 196.4 lb

## 2016-12-31 DIAGNOSIS — R197 Diarrhea, unspecified: Secondary | ICD-10-CM | POA: Diagnosis not present

## 2016-12-31 MED ORDER — DIPHENOXYLATE-ATROPINE 2.5-0.025 MG PO TABS: 1.0000 | ORAL_TABLET | Freq: Four times a day (QID) | ORAL | 0 refills | Status: DC | PRN

## 2016-12-31 MED ORDER — CETIRIZINE HCL 10 MG PO TABS: 10.0000 mg | ORAL_TABLET | Freq: Every day | ORAL | 6 refills | Status: DC

## 2016-12-31 MED ORDER — HYDROCORTISONE ACETATE 25 MG RE SUPP: 25.0000 mg | Freq: Two times a day (BID) | RECTAL | 0 refills | Status: DC

## 2016-12-31 MED ORDER — CIPROFLOXACIN HCL 500 MG PO TABS: 500.0000 mg | ORAL_TABLET | Freq: Two times a day (BID) | ORAL | 0 refills | Status: DC

## 2016-12-31 MED ORDER — ONDANSETRON 8 MG PO TBDP: 8.0000 mg | ORAL_TABLET | Freq: Three times a day (TID) | ORAL | 0 refills | Status: DC | PRN

## 2016-12-31 NOTE — Progress Notes (Signed)
Subjective:    Patient ID: Zachary Kerr, male    DOB: 1976/08/31, 41 y.o.   MRN: [id number]  HPI  Pt in for recent diarrhea that started 2 days ago. Pt states no recent antibiotics. Pt states he remembers eating k[employer] that day he got sick. But other family ate and did not get sick. Today just started to get nausea and vomited twice. He get little body aches at night last 2 nights.  Pt states last 2 days he may have had 50 loose stools. He did not count number. He states yesterday he felt well but mild fatigue today. Pt off of work until Monday.   Pt mentioned at very end of interview also rare occasional bright red blood on toilet paper over past 2 months. 3-4 month ago felt small  lump near rectum but then went away. No rectal itching. No black stool. No family history of colon cancer or inflammatory bowel diseases that he knows of.     Review of Systems  Constitutional: Negative for chills, fatigue and fever.  HENT: Negative for congestion, dental problem, sinus pain, sinus pressure and sore throat.   Respiratory: Negative for cough, choking, shortness of breath and wheezing.   Cardiovascular: Negative for chest pain and palpitations.  Gastrointestinal: Positive for abdominal pain, diarrhea, nausea and vomiting. Negative for blood in stool.       Faint abd discomfort  Musculoskeletal: Negative for back pain, myalgias and neck pain.  Skin: Negative for rash.  Neurological: Negative for dizziness, seizures, syncope, weakness, numbness and headaches.  Hematological: Negative for adenopathy. Does not bruise/bleed easily.  Psychiatric/Behavioral: Negative for behavioral problems and confusion. The patient is not nervous/anxious.     Past Medical History:  Diagnosis Date  . Environmental allergies   . GERD (gastroesophageal reflux disease)      Social History   Social History  . Marital status: Married    Spouse name: N/A  . Number of children: 2  . Years of education: N/A    Occupational History  . Forklift operator     Social History Main Topics  . Smoking status: Former Research scientist (life sciences)  . Smokeless tobacco: Never Used  . Alcohol use No  . Drug use: No  . Sexual activity: Not on file   Other Topics Concern  . Not on file   Social History Narrative   From [place]           Past Surgical History:  Procedure Laterality Date  . ROTATOR CUFF REPAIR Left 2010    Family History  Problem Relation Age of Onset  . Alcohol abuse Father     GF  . Diabetes Neg Hx   . Hypertension Neg Hx   . Heart attack Neg Hx   . Colon cancer Neg Hx   . Prostate cancer Neg Hx     No Known Allergies  Current Outpatient Prescriptions on File Prior to Visit  Medication Sig Dispense Refill  . dexlansoprazole (DEXILANT) 60 MG capsule Take 1 capsule (60 mg total) by mouth daily. 30 capsule 1   No current facility-administered medications on file prior to visit.     BP 119/82 (BP Location: Left Arm, Patient Position: Sitting, Cuff Size: Normal)   Pulse 95   Temp 98.9 F (37.2 C) (Oral)   Resp 18   Wt 196 lb 6.4 oz (89.1 kg)   SpO2 97%   BMI 31.89 kg/m       Objective:   Physical Exam  General Appearance- Not in acute distress.  HEENT Eyes- Scleraeral/Conjuntiva-bilat- Not Yellow. Mouth & Throat- Normal.  Chest and Lung Exam Auscultation: Breath sounds:-Normal. Adventitious sounds:- No Adventitious sounds.  Cardiovascular Auscultation:Rythm - Regular. Heart Sounds -Normal heart sounds.  Abdomen Inspection:-Inspection Normal.  Palpation/Perucssion: Palpation and Percussion of the abdomen reveal- faint epigastric Tender, No Rebound tenderness, No rigidity(Guarding) and No Palpable abdominal masses.  Liver:-Normal.  Spleen:- Normal.   Rectal Anorectal Exam: . External - normal external exam. Internal - normal sphincter tone. No rectal mass. Outside- 1oclock position. tiny hemorrhoid but not thrombosed.      Assessment & Plan:  For your  diarrhea will rx lomotil. For nausea or vomiting rx zofran.  Please turn in stool panel kit tomorrow am and get labs.  After you turn in stool kit start cipro antibiotic.  Get propel tonight and hydrate well. Eat bland diet.  If you feel worse then ED evaluation.  Follow up in 5-7 days or as needed  Pt did not appear dehydrated on exam. Vitals stable. And buccal mucos moist.

## 2016-12-31 NOTE — Progress Notes (Signed)
Pre visit review using our clinic review tool, if applicable. No additional management support is needed unless otherwise documented below in the visit note. 

## 2016-12-31 NOTE — Patient Instructions (Addendum)
For your diarrhea will rx lomotil. For nausea or vomiting rx zofran.  Please turn in stool panel kit tomorrow am and get labs.  After you turn in stool kit start cipro antibiotic.  Get propel tonight and hydrate well. Eat bland diet.  If you feel worse then ED evaluation.  Follow up in 5-7 days or as needed  When you get over current illness and if you feel recurrent small lump in rectum and see slight blood when you wipe then start annusol hc. Suppository.

## 2017-01-01 ENCOUNTER — Other Ambulatory Visit (INDEPENDENT_AMBULATORY_CARE_PROVIDER_SITE_OTHER): Payer: Managed Care, Other (non HMO)

## 2017-01-01 DIAGNOSIS — R197 Diarrhea, unspecified: Secondary | ICD-10-CM | POA: Diagnosis not present

## 2017-01-01 LAB — CBC WITH DIFFERENTIAL/PLATELET
BASOS ABS: 0 10*3/uL (ref 0.0–0.1)
Basophils Relative: 0.2 % (ref 0.0–3.0)
EOS ABS: 0.1 10*3/uL (ref 0.0–0.7)
Eosinophils Relative: 2.2 % (ref 0.0–5.0)
HEMATOCRIT: 47.4 % (ref 39.0–52.0)
HEMOGLOBIN: 16.3 g/dL (ref 13.0–17.0)
LYMPHS PCT: 35.7 % (ref 12.0–46.0)
Lymphs Abs: 1.9 10*3/uL (ref 0.7–4.0)
MCHC: 34.5 g/dL (ref 30.0–36.0)
MCV: 89 fl (ref 78.0–100.0)
Monocytes Absolute: 1 10*3/uL (ref 0.1–1.0)
Monocytes Relative: 19.9 % — ABNORMAL HIGH (ref 3.0–12.0)
Neutro Abs: 2.2 10*3/uL (ref 1.4–7.7)
Neutrophils Relative %: 42 % — ABNORMAL LOW (ref 43.0–77.0)
Platelets: 308 10*3/uL (ref 150.0–400.0)
RBC: 5.32 Mil/uL (ref 4.22–5.81)
RDW: 12.9 % (ref 11.5–15.5)
WBC: 5.2 10*3/uL (ref 4.0–10.5)

## 2017-01-01 LAB — COMPREHENSIVE METABOLIC PANEL
ALBUMIN: 4.2 g/dL (ref 3.5–5.2)
ALK PHOS: 58 U/L (ref 39–117)
ALT: 84 U/L — AB (ref 0–53)
AST: 42 U/L — AB (ref 0–37)
BILIRUBIN TOTAL: 0.7 mg/dL (ref 0.2–1.2)
BUN: 15 mg/dL (ref 6–23)
CO2: 24 mEq/L (ref 19–32)
CREATININE: 1.19 mg/dL (ref 0.40–1.50)
Calcium: 9.1 mg/dL (ref 8.4–10.5)
Chloride: 104 mEq/L (ref 96–112)
GFR: 70.94 mL/min (ref >60.00)
Glucose, Bld: 103 mg/dL — ABNORMAL HIGH (ref 70–99)
Potassium: 3.2 mEq/L — ABNORMAL LOW (ref 3.5–5.1)
Sodium: 136 mEq/L (ref 135–145)
TOTAL PROTEIN: 7.6 g/dL (ref 6.0–8.3)

## 2017-01-01 MED FILL — DIPHENOXYLATE/ATROPINE TAB: 2.5-0.025 | 4 days supply | Qty: 16 | Fill #0

## 2017-04-08 ENCOUNTER — Encounter: Payer: Managed Care, Other (non HMO) | Admitting: Internal Medicine

## 2017-04-16 ENCOUNTER — Encounter: Payer: Managed Care, Other (non HMO) | Admitting: Internal Medicine

## 2017-04-16 ENCOUNTER — Telehealth: Payer: Self-pay | Admitting: Internal Medicine

## 2017-04-16 NOTE — Telephone Encounter (Signed)
Caller name: Burley Saveredro  Relation to pt: self  Call back [phone] Pharmacy:  Reason for call:   Pt concern about no show fee charge for 12-03-2016, pt stated did call our office to cancel 24 hr ahead of time and still got a bill for no show. Please verify.

## 2017-05-13 ENCOUNTER — Encounter: Payer: Managed Care, Other (non HMO) | Admitting: Internal Medicine

## 2017-06-15 ENCOUNTER — Encounter: Payer: Managed Care, Other (non HMO) | Admitting: Internal Medicine

## 2017-06-15 DIAGNOSIS — Z0289 Encounter for other administrative examinations: Secondary | ICD-10-CM

## 2018-11-29 ENCOUNTER — Ambulatory Visit (INDEPENDENT_AMBULATORY_CARE_PROVIDER_SITE_OTHER): Payer: 59 | Admitting: Internal Medicine

## 2018-11-29 ENCOUNTER — Encounter: Payer: Self-pay | Admitting: Internal Medicine

## 2018-11-29 VITALS — BP 116/80 | HR 60 | Temp 98.0°F | Resp 16 | Ht 65.8 in | Wt 208.1 lb

## 2018-11-29 DIAGNOSIS — Z0189 Encounter for other specified special examinations: Secondary | ICD-10-CM | POA: Diagnosis not present

## 2018-11-29 DIAGNOSIS — Z0001 Encounter for general adult medical examination with abnormal findings: Secondary | ICD-10-CM

## 2018-11-29 DIAGNOSIS — Z114 Encounter for screening for human immunodeficiency virus [HIV]: Secondary | ICD-10-CM | POA: Diagnosis not present

## 2018-11-29 DIAGNOSIS — K649 Unspecified hemorrhoids: Secondary | ICD-10-CM

## 2018-11-29 DIAGNOSIS — Z0000 Encounter for general adult medical examination without abnormal findings: Secondary | ICD-10-CM

## 2018-11-29 MED ORDER — HYDROCORTISONE ACETATE 25 MG RE SUPP: 25.0000 mg | Freq: Two times a day (BID) | RECTAL | 1 refills | Status: DC | PRN

## 2018-11-29 NOTE — Progress Notes (Signed)
Subjective:    Patient ID: Zachary Kerr, male    DOB: 1975/12/06, 43 y.o.   MRN: [id number]  DOS:  11/29/2018 Type of visit - description: CPX Last office visit with me 2015, request a physical exam.  He does have some concerns    Review of Systems Reports that he works from 4 AM in the morning to 1PM  in the afternoon, more than once when he drives  back home he falls asleep driving. Admits to snoring and have a broken sleep.  This is an ongoing issue. Also, has noted red blood per rectum with bowel movements on and off "for a while".  No rectal pain but sometimes after a bowel movement he feels little lumps  on the rectum; lumps self-resolve. Denies nausea, vomiting, abdominal pain.  No weight loss, he actually has gained weight.   Other than above, a 14 point review of systems is negative   Past Medical History:  Diagnosis Date  . Environmental allergies   . GERD (gastroesophageal reflux disease)     Past Surgical History:  Procedure Laterality Date  . ROTATOR CUFF REPAIR Left 2010    Social History   Socioeconomic History  . Marital status: Married    Spouse name: Not on file  . Number of children: 3  . Years of education: Not on file  . Highest education level: Not on file  Occupational History  . Occupation: Estate agent   Social Needs  . Financial resource strain: Not on file  . Food insecurity:    Worry: Not on file    Inability: Not on file  . Transportation needs:    Medical: Not on file    Non-medical: Not on file  Tobacco Use  . Smoking status: Former Games developer  . Smokeless tobacco: Never Used  Substance and Sexual Activity  . Alcohol use: Yes    Comment: socially   . Drug use: No  . Sexual activity: Not on file  Lifestyle  . Physical activity:    Days per week: Not on file    Minutes per session: Not on file  . Stress: Not on file  Relationships  . Social connections:    Talks on phone: Not on file    Gets together: Not on file    Attends  religious service: Not on file    Active member of club or organization: Not on file    Attends meetings of clubs or organizations: Not on file    Relationship status: Not on file  . Intimate partner violence:    Fear of current or ex partner: Not on file    Emotionally abused: Not on file    Physically abused: Not on file    Forced sexual activity: Not on file  Other Topics Concern  . Not on file  Social History Narrative   [place]            Family History  Problem Relation Age of Onset  . Alcohol abuse Father        GF  . Benign prostatic hyperplasia Other        auncle   . Diabetes Neg Hx   . Hypertension Neg Hx   . Heart attack Neg Hx   . Colon cancer Neg Hx   . Prostate cancer Neg Hx      Allergies as of 11/29/2018   No Known Allergies     Medication List  Accurate as of November 29, 2018 11:59 PM. Always use your most recent med list.        hydrocortisone 25 MG suppository Commonly known as:  ANUSOL-HC Place 1 suppository (25 mg total) rectally 2 (two) times daily as needed for hemorrhoids or anal itching.           Objective:   Physical Exam BP 116/80 (BP Location: Right Arm, Patient Position: Sitting, Cuff Size: Normal)   Pulse 60   Temp 98 F (36.7 C) (Oral)   Resp 16   Ht 5' 5.8" (1.671 m)   Wt 208 lb 2 oz (94.4 kg)   SpO2 98%   BMI 33.80 kg/m  General: Well developed, NAD, BMI noted Neck: No  thyromegaly.  Anatomically, neck is short HEENT:  Normocephalic . Face symmetric, atraumatic.  Throat is rounded. Lungs:  CTA B Normal respiratory effort, no intercostal retractions, no accessory muscle use. Heart: RRR,  no murmur.  No pretibial edema bilaterally  Abdomen:  Not distended, soft, non-tender. No rebound or rigidity.   Rectal: External abnormalities: Few skin tags  Normal sphincter tone. No rectal masses or tenderness.  Brown stools Prostate: Prostate gland firm and smooth, no enlargement, nodularity, tenderness,  mass, asymmetry or induration Anoscopy: + Internal hemorrhoids, multiple, small Skin: Exposed areas without rash. Not pale. Not jaundice Neurologic:  alert & oriented X3.  Speech normal, gait appropriate for age and unassisted Strength symmetric and appropriate for age.  Psych: Cognition and judgment appear intact.  Cooperative with normal attention span and concentration.  Behavior appropriate. No anxious or depressed appearing.     Assessment    43 year old gentleman history of GERD presents for CPX  History of GERD: Not an issue at this point OSA: Strongly suspected sleep apnea based on symptoms.  BMI is 33 but anatomically his neck is short.  Throat is crowded.  Refer to neurology.  Strongly encouraged not to drive if sleepy. Internal hemorrhoids: Red blood per rectum likely to be from internal hemorrhoids, previously was prescribed suppositories, will send a refill.  Recommend keep hydrated, Metamucil, call if symptoms severe. RTC 6 months.

## 2018-11-29 NOTE — Assessment & Plan Note (Signed)
-  Td 2011 -CCS: No family history of colon cancer.  Has internal hemorrhoids. - Prostate cancer screening: DRE performed today due to rectal bleeding, prostate is normal, no family history although states that his uncle had some "prostate problems". -Diet and exercise discussed -Labs CMP, FLP, CBC, A1c, TSH, HIV

## 2018-11-29 NOTE — Progress Notes (Signed)
Pre visit review using our clinic review tool, if applicable. No additional management support is needed unless otherwise documented below in the visit note. 

## 2018-11-29 NOTE — Patient Instructions (Addendum)
GO TO THE LAB : Get the blood work     GO TO THE FRONT DESK Schedule your next appointment for a checkup in 6 months  Use the suppositories as needed for rectal bleeding.  If you have severe symptoms let me know  Do not drive if you are sleepy  We are referring you to a neurologist for sleep apnea evaluation    Apnea del sueo Sleep Apnea La apnea del sueo afecta la respiracin mientras se duerme. Hace que la respiracin se detenga por poco tiempo o se vuelva superficial. Tambin puede aumentar el riesgo de:  Infarto de miocardio.  Accidente cerebrovascular.  Tener mucho sobrepeso (obesidad).  Diabetes.  Insuficiencia cardaca.  Latidos cardacos irregulares. El [place] del tratamiento es ayudarle a respirar normalmente otra vez. Cules son las causas? Existen tres tipos de apnea del sueo:  Apnea obstructiva del sueo. Esta ocurre cuando las vas respiratorias se obstruyen o colapsan.  Apnea central del sueo. Esta ocurre cuando el cerebro no enva las seales correctas a los msculos que controlan la respiracin.  Apnea mixta del sueo. Esta es una combinacin de apnea obstructiva y central del sueo. La causa ms frecuente de esta afeccin es la obstruccin o el colapso de las vas respiratorias. Esto puede suceder si:  Los msculos de la garganta estn demasiado relajados.  Tiene la lengua y las [address].  Tiene sobrepeso.  Tiene las vas respiratorias demasiado pequeas. Qu incrementa el riesgo?  Tener sobrepeso.  Fumar.  Tener vas respiratorias pequeas.  El envejecimiento.  Ser hombre.  El consumo de alcohol.  Tomar medicamentos para calmarse (sedantes o tranquilizantes).  Tener familiares con esta afeccin. Cules son los signos o los sntomas?  Dificultad para permanecer dormido.  Estar somnoliento o cansado Administrator.  Enojarse mucho.  Ronquidos fuertes.  Dolor de cabeza por la maana.  Imposibilidad  de enfocar la mente (concentrarse).  Olvidar cosas.  Menos inters por el sexo.  Cambios en el estado de nimo.  Cambios en la personalidad.  Sentimientos de tristeza (depresin).  Levantarse mucho durante la noche para ir a Geographical information systems officer.  Sequedad en la boca.  Dolor de Advertising copywriter. Cmo se diagnostica?  Sus antecedentes mdicos.  Un examen fsico.  Neomia Dear prueba que se realiza mientras la persona duerme (estudio del sueo). La prueba se realiza con mayor frecuencia en un laboratorio del sueo, pero tambin puede Management consultant. Cmo se trata?   Dormir de Mudlogger.  Usar un medicamento para eliminar la mucosidad de la nariz (descongestivo).  Evitar el consumo de alcohol, medicamentos que ayudan a relajarse o ciertos analgsicos (narcticos).  Bajar de [place], si es necesario.  Cambios en la dieta.  No fumar.  Usar una mquina para abrir las vas respiratorias mientras duerme; por ejemplo: ? Un aparato bucal. Se trata de una boquilla que desplaza la mandbula hacia adelante. ? Un dispositivo CPAP. Este dispositivo sopla aire a travs de una mscara cuando usted exhala. ? Un dispositivo EPAP. Este tiene vlvulas que se colocan en cada fosa nasal. ? Un dispositivo BPAP. Este dispositivo sopla aire a travs de una mscara cuando usted inhala y exhala.  Someterse a Biomedical engineer tratamientos no Comptroller. Realizar un tratamiento para la apnea del sueo es importante. Sin tratamiento, esta afeccin puede derivar en lo siguiente:  Presin arterial alta.  Arteriopata coronaria.  En los hombres, no poder tener una ereccin (impotencia).  Reduccin de la capacidad de pensar. Siga estas instrucciones en  su casa: Estilo de [employer] cambios que le haya recomendado el mdico.  Siga una dieta saludable.  Baje de peso, si es necesario.  Evite el alcohol, los medicamentos para relajarse y Scientific laboratory technician.  No consuma ningn producto que contenga  nicotina o tabaco, como cigarrillos, cigarrillos electrnicos y tabaco de Theatre manager. Si necesita ayuda para dejar de fumar, consulte al [employer]. Instrucciones generales  [employer] de venta libre y los recetados solamente como se lo haya indicado el mdico.  Si le proporcionaron una mquina para usar mientras duerme, sela solamente como se lo haya indicado el mdico.  Si va a someterse a [place], no olvide informarle al mdico que tiene apnea del sueo. Puede ser necesario que lleve su dispositivo consigo.  Concurra a todas las visitas de [address] se lo haya indicado el mdico. Esto es importante. Comunquese con un mdico si:  El Astronomer para usar mientras duerme le [place] o parece no funcionar.  No se siente mejor.  Empeora. Solicite ayuda inmediatamente si:  Le duele el pecho.  Tiene dificultad para inhalar suficiente aire.  Tiene molestias en la espalda, en los brazos o en el [place].  Tiene dificultad para hablar.  Siente debilidad en un lado del cuerpo.  Se le cae un lado de la cara. Estos sntomas pueden Customer service manager. No espere a ver si los sntomas desaparecen. Solicite atencin mdica de inmediato. Comunquese con el servicio de emergencias de su localidad (911 en los [place]). No conduzca por sus propios medios [employer] hospital. Resumen  Esta afeccin afecta la respiracin durante el sueo.  La causa ms frecuente es la obstruccin o el colapso de las vas respiratorias.  El [place] del tratamiento es ayudarlo a respirar normalmente mientras duerme. Esta informacin no tiene Theme park manager el consejo del mdico. Asegrese de hacerle al mdico cualquier pregunta que tenga. Document Released: 10/17/2010 Document Revised: 06/08/2018 Document Reviewed: 06/08/2018 Elsevier Interactive Patient Education  [employer].

## 2018-11-30 DIAGNOSIS — Z09 Encounter for follow-up examination after completed treatment for conditions other than malignant neoplasm: Secondary | ICD-10-CM | POA: Insufficient documentation

## 2018-11-30 LAB — CBC WITH DIFFERENTIAL/PLATELET
BASOS PCT: 1 % (ref 0.0–3.0)
Basophils Absolute: 0.1 10*3/uL (ref 0.0–0.1)
EOS ABS: 0.2 10*3/uL (ref 0.0–0.7)
Eosinophils Relative: 3 % (ref 0.0–5.0)
HEMATOCRIT: 45 % (ref 39.0–52.0)
HEMOGLOBIN: 15.5 g/dL (ref 13.0–17.0)
LYMPHS PCT: 39.4 % (ref 12.0–46.0)
Lymphs Abs: 2.4 10*3/uL (ref 0.7–4.0)
MCHC: 34.4 g/dL (ref 30.0–36.0)
MCV: 90.5 fl (ref 78.0–100.0)
MONOS PCT: 11.5 % (ref 3.0–12.0)
Monocytes Absolute: 0.7 10*3/uL (ref 0.1–1.0)
Neutro Abs: 2.8 10*3/uL (ref 1.4–7.7)
Neutrophils Relative %: 45.1 % (ref 43.0–77.0)
Platelets: 320 10*3/uL (ref 150.0–400.0)
RBC: 4.98 Mil/uL (ref 4.22–5.81)
RDW: 12.9 % (ref 11.5–15.5)
WBC: 6.2 10*3/uL (ref 4.0–10.5)

## 2018-11-30 LAB — COMPREHENSIVE METABOLIC PANEL
ALBUMIN: 4.4 g/dL (ref 3.5–5.2)
ALT: 74 U/L — AB (ref 0–53)
AST: 29 U/L (ref 0–37)
Alkaline Phosphatase: 78 U/L (ref 39–117)
BILIRUBIN TOTAL: 0.5 mg/dL (ref 0.2–1.2)
BUN: 12 mg/dL (ref 6–23)
CALCIUM: 9.7 mg/dL (ref 8.4–10.5)
CHLORIDE: 104 meq/L (ref 96–112)
CO2: 26 mEq/L (ref 19–32)
CREATININE: 0.95 mg/dL (ref 0.40–1.50)
GFR: 85.8 mL/min (ref >60.00)
Glucose, Bld: 94 mg/dL (ref 70–99)
Potassium: 4.1 mEq/L (ref 3.5–5.1)
SODIUM: 138 meq/L (ref 135–145)
TOTAL PROTEIN: 7.3 g/dL (ref 6.0–8.3)

## 2018-11-30 LAB — LIPID PANEL
CHOLESTEROL: 190 mg/dL (ref 0–200)
HDL: 58.9 mg/dL (ref >39.00)
LDL CALC: 97 mg/dL (ref 0–99)
NonHDL: 131.1
Total CHOL/HDL Ratio: 3
Triglycerides: 172 mg/dL — ABNORMAL HIGH (ref 0.0–149.0)
VLDL: 34.4 mg/dL (ref 0.0–40.0)

## 2018-11-30 LAB — TSH: TSH: 1.95 u[IU]/mL (ref 0.35–4.50)

## 2018-11-30 LAB — HEMOGLOBIN A1C: Hgb A1c MFr Bld: 5.6 % (ref 4.6–6.5)

## 2018-11-30 LAB — HIV ANTIBODY (ROUTINE TESTING W REFLEX): HIV 1&2 Ab, 4th Generation: NONREACTIVE

## 2018-11-30 NOTE — Assessment & Plan Note (Signed)
History of GERD: Not an issue at this point OSA: Strongly suspected sleep apnea based on symptoms.  BMI is 33 but anatomically his neck is short.  Throat is crowded.  Refer to neurology.  Strongly encouraged not to drive if sleepy. Internal hemorrhoids: Red blood per rectum likely to be from internal hemorrhoids, previously was prescribed suppositories, will send a refill.  Recommend keep hydrated, Metamucil, call if symptoms severe. RTC 6 months.

## 2019-01-10 ENCOUNTER — Telehealth: Payer: Self-pay

## 2019-01-10 NOTE — Telephone Encounter (Signed)
Due to current COVID 19 pandemic, our office is severely reducing in office visits for at least the next 2 weeks, in order to minimize the risk to our patients and healthcare providers.   I called pt, he is agreeable to a virtual visit at his appt time.  Pt understands that although there may be some limitations with this type of visit, we will take all precautions to reduce any security or privacy concerns.  Pt understands that this will be treated like an in office visit and we will file with pt's insurance, and there may be a patient responsible charge related to this service.  Pt's email is [email or web address]. Pt understands that the cisco webex software must be downloaded and operational on the device pt plans to use for the visit.  Pt's meds, allergies, and PMH were updated.  Pt has never had a sleep study but does endorse snoring.  Pt measured his neck size while we were on the phone and he reports that it is 17 inches.  Pt reports that he weighs 200 lbs and he is 5'5.  Epworth Sleepiness Scale 0= would never doze 1= slight chance of dozing 2= moderate chance of dozing 3= high chance of dozing  Sitting and reading: 2 Watching TV: 3 Sitting inactive in a public place (ex. Theater or meeting): 0 As a passenger in a car for an hour without a break: 0 Lying down to rest in the afternoon: 2 Sitting and talking to someone: 1 Sitting quietly after lunch (no alcohol): 2 In a car, while stopped in traffic: 1 Total: 11  FSS: 27

## 2019-01-24 ENCOUNTER — Other Ambulatory Visit: Payer: Self-pay

## 2019-01-24 ENCOUNTER — Ambulatory Visit (INDEPENDENT_AMBULATORY_CARE_PROVIDER_SITE_OTHER): Payer: 59 | Admitting: Neurology

## 2019-01-24 ENCOUNTER — Encounter: Payer: Self-pay | Admitting: Neurology

## 2019-01-24 DIAGNOSIS — R0683 Snoring: Secondary | ICD-10-CM | POA: Diagnosis not present

## 2019-01-24 DIAGNOSIS — R0681 Apnea, not elsewhere classified: Secondary | ICD-10-CM | POA: Diagnosis not present

## 2019-01-24 DIAGNOSIS — E669 Obesity, unspecified: Secondary | ICD-10-CM

## 2019-01-24 DIAGNOSIS — G4719 Other hypersomnia: Secondary | ICD-10-CM | POA: Diagnosis not present

## 2019-01-24 NOTE — Patient Instructions (Signed)
Given verbally, during today's virtual video-based encounter, with verbal feedback received.   

## 2019-01-24 NOTE — Progress Notes (Signed)
Huston FoleySaima Darriel Sinquefield, MD, PhD [facility] [address], [address] [address] [place], [place] [id number]   Virtual Visit via Video Note on 01/24/2019: I connected with Mr. Mean on 01/24/19 at  4:00 PM EDT by a video enabled telemedicine application and verified that I am speaking with the correct person using two identifiers.   I discussed the limitations of evaluation and management by telemedicine and the availability of in person appointments. The patient expressed understanding and agreed to proceed.  History of Present Illness: Mr. Zachary Kerr is a 43 year old right-handed gentleman with an underlying medical history of allergies, reflux disease, with whom I am conducting a virtual, video based new patient visit via [email or web address] in lieu of a face-to-face visit for evaluation of his sleep disorder, in particular, evaluation for obstructive sleep apnea. The patient is unaccompanied today and joins via cell phone from home and I also called his phone as his audio through [email or web address] was not working, although he could hear me through it. He is referred by his primary care physician, Dr. Porfirio OarJos Paz and I reviewed his note from 11/29/2018. He reports snoring, excessive daytime somnolence and occasional choking sensations and sounds of choking per wife's report. He lives with his wife and 3 children. 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 Bedtime is around 9 and rise time around 2:30 currently. He does not have night to night nocturia and denies morning headaches. He does not always feel rested and does get tired when he comes home from work. Specially when sedentary he will have a tendency to fall asleep. He drinks caffeine in the form of coffee, 2 cups in the morning and one energy drink in the afternoon. He drinks alcohol occasionally, he is a nonsmoker. They have 1 dog in the household but not in the bedroom at night, he does not watch TV in his  bedroom. His weight has been fluctuating. When he tried to lose weight he was able to lose some, he is currently back at 200. His Epworth sleepiness score is 11 out of 24, fatigue score is 27 out of 63.    His Past Medical History Is Significant For: Past Medical History:  Diagnosis Date   Environmental allergies    GERD (gastroesophageal reflux disease)     His Past Surgical History Is Significant For: Past Surgical History:  Procedure Laterality Date   ROTATOR CUFF REPAIR Left 2010    His Family History Is Significant For: Family History  Problem Relation Age of Onset   Alcohol abuse Father        GF   Benign prostatic hyperplasia Other        auncle    Diabetes Neg Hx    Hypertension Neg Hx    Heart attack Neg Hx    Colon cancer Neg Hx    Prostate cancer Neg Hx     His Social History Is Significant For: Social History   Socioeconomic History   Marital status: Married    Spouse name: Not on file   Number of children: 3   Years of education: Not on file   Highest education level: Not on file  Occupational History   Occupation: Estate agentorklift operator   Social Needs   Financial resource strain: Not on file   Food insecurity:    Worry: Not on file    Inability: Not on file   Transportation needs:    Medical: Not on  file    Non-medical: Not on file  Tobacco Use   Smoking status: Former Smoker   Smokeless tobacco: Never Used  Substance and Sexual Activity   Alcohol use: Yes    Comment: socially    Drug use: No   Sexual activity: Not on file  Lifestyle   Physical activity:    Days per week: Not on file    Minutes per session: Not on file   Stress: Not on file  Relationships   Social connections:    Talks on phone: Not on file    Gets together: Not on file    Attends religious service: Not on file    Active member of club or organization: Not on file    Attends meetings of clubs or organizations: Not on file    Relationship status:  Not on file  Other Topics Concern   Not on file  Social History Narrative   [place]           His Allergies Are:  No Known Allergies:   His Current Medications Are:  Outpatient Encounter Medications as of 01/24/2019  Medication Sig   hydrocortisone (ANUSOL-HC) 25 MG suppository Place 1 suppository (25 mg total) rectally 2 (two) times daily as needed for hemorrhoids or anal itching.   No facility-administered encounter medications on file as of 01/24/2019.   :   Review of Systems:  Out of a complete 14 point review of systems, all are reviewed and negative with the exception of these symptoms as listed below:  Observations/Objective: His most recent vital signs available for my review in his chart are from 11/29/2018: Blood pressure 116/80, pulse 60, temperature 90.8, weight 208.13 pounds for BMI of 33.8.  His most recent weight at home by self report is 200 lb, neck circumference by self-report is 17 inches.  On examination, he is pleasant, conversant, in no acute distress. Speech is clear, not dysarthric, no hypophonia or voice tremor. Face is symmetric with normal facial animation and normal extraocular movements. He has no obvious nystagmus. Airway examination reveals a moderately crowded airway secondary to figure tongue and thicker soft palate, smaller airway entry, tonsils not fully visualized, Mallampati appears to be class II. Hearing is grossly intact. He walks without problems, posture is normal. Upper body coordination unremarkable.    Assessment and Plan: Mr. Tasheem Elms is a 43 year old right-handed gentleman with an underlying medical history of allergies, reflux disease, with whom I am conducting a virtual, video based new patient visit via [email or web address] in lieu of a face-to-face visit for evaluation of an underlying organic sleep disorder, in particular, concern for obstructive sleep apnea. The patient's medical history and physical exam (albeit limited with current  video-based evaluation) are concerning for a diagnosis of obstructive sleep apnea. I discussed with the patient the diagnosis of OSA, its prognosis and treatment options. I explained in particular the risks and ramifications of untreated moderate to severe OSA, especially with respect to developing cardiovascular disease down the Road, including congestive heart failure, difficult to treat hypertension, cardiac arrhythmias, or stroke. Even type 2 diabetes has, in part, been linked to untreated OSA. Symptoms of untreated OSA may include daytime sleepiness, memory problems, mood irritability and mood disorder such as depression and anxiety, lack of energy, as well as recurrent headaches, especially morning headaches. We talked about the importance of weight control. We talked about the importance of maintaining good sleep hygiene. I recommended the following at this time: home sleep test.  I  explained the sleep test procedure to the patient and also outlined possible treatment options of OSA, including the use of CPAP vs. AutoPAP treatment option to the patient, who indicated that he would be willing to try CPAP if the need arises. I answered all his questions today and the patient was in agreement. I plan to see the patient back after the sleep study is completed and encouraged his to call with any interim questions, concerns, problems or updates.   Huston Foley, MD, PhD    Follow Up Instructions: 1. HST, sleep lab staff will reach out to patient to arrange for either sending the unit to home address or a "drive by pickup" and for tutorial, making sure patient is comfortable with the unit and usage, and return of equipment, if necessary.  2. Consider AutoPap therapy, if home sleep test positive for obstructive sleep apnea, patient agreeable. 3. We talked about alternative treatment options and current limitations, due to virus pandemic.  4. Follow-up after starting AutoPap therapy, follow-up to be  scheduled according to set-up date, typically within 31 to 89 days post treatment start. 5. Pursue healthy lifestyle, good sleep hygiene, healthy weight. 6. Call for any interim questions or concerns.   I discussed the assessment and treatment plan with the patient. The patient was provided an opportunity to ask questions and all were answered. The patient agreed with the plan and demonstrated an understanding of the instructions.   The patient was advised to call back or seek an in-person evaluation if the symptoms worsen or if the condition fails to improve as anticipated.  I provided 30 minutes of non-face-to-face time during this encounter.   Huston Foley, MD

## 2019-01-24 NOTE — Telephone Encounter (Signed)
Check in staff called pt to remind him of his virtual appt. Pt was coming to the office.   I called pt, reminded him of the virtual appt. He is agreeable to doing [email or web address] instead of cisco webex. Texted pt the link. He knows to click the link 15 minutes prior to his appt.

## 2019-03-06 ENCOUNTER — Other Ambulatory Visit: Payer: Self-pay

## 2019-03-06 ENCOUNTER — Ambulatory Visit (INDEPENDENT_AMBULATORY_CARE_PROVIDER_SITE_OTHER): Payer: 59 | Admitting: Neurology

## 2019-03-06 DIAGNOSIS — R0683 Snoring: Secondary | ICD-10-CM

## 2019-03-06 DIAGNOSIS — G4719 Other hypersomnia: Secondary | ICD-10-CM

## 2019-03-06 DIAGNOSIS — E669 Obesity, unspecified: Secondary | ICD-10-CM

## 2019-03-06 DIAGNOSIS — G4733 Obstructive sleep apnea (adult) (pediatric): Secondary | ICD-10-CM

## 2019-03-06 DIAGNOSIS — R0681 Apnea, not elsewhere classified: Secondary | ICD-10-CM

## 2019-03-13 ENCOUNTER — Telehealth: Payer: Self-pay

## 2019-03-13 NOTE — Telephone Encounter (Signed)
I called pt to discuss his sleep study results. No answer, left a message asking him to call me back. 

## 2019-03-13 NOTE — Addendum Note (Signed)
Addended by: Star Age on: 03/13/2019 09:23 AM   Modules accepted: Orders

## 2019-03-13 NOTE — Telephone Encounter (Signed)
-----   Message from Star Age, MD sent at 03/13/2019  9:23 AM EDT ----- Patient referred by Dr. Larose Kells, seen virtually by me on 01/24/19, HST on 03/06/19.    Please call and notify the patient that the recent home sleep test showed obstructive sleep apnea in the moderate range. While I recommend treatment for this in the form CPAP, we are not yet bringing patients in for in-lab testing for CPAP titration studies, due to the virus pandemic; therefore, I suggest we start him on autoPAP at home, which means, that we don't have to bring him in for a sleep study with CPAP, but will let him start using an autoPAP machine at home, through a DME company (of patient's choice, or as per insurance requirement, as per in [employer], if there are such restrictions, depending on insurance carrier). The DME representative will educate the patient on how to use the machine, how to put the mask on, etc. I have placed an order in the chart. Please send referral, talk to patient, send report to referring MD. We will need a FU in sleep clinic for 10 weeks post-PAP set up, please arrange that with me or one of our NPs.  Also, please remind patient about the importance of compliance with PAP usage; this is an Designer, industrial/product, but good compliance also helps [place] track improvements in patient's sleep related complaints and objective improvements, such as BP and weight for example or nocturia or headaches, etc. For concerns and questions about how to clean the PAP machine and the supplies and how frequently to change the hose, mask and filters, etc., patient can call the DME company, for more information, education and troubleshooting. Especially in the current situation, I recommend, patients be extra mindful about hand hygiene, handling the PAP equipment only with clean hands, wipe the mask daily, keep little one and four-legged companions (and any other pets for that matter) away from the machine and mask at all times.      Thanks,   Star Age, MD, PhD [facility])

## 2019-03-13 NOTE — Progress Notes (Signed)
Patient referred by Dr. Larose Kells, seen virtually by me on 01/24/19, HST on 03/06/19.    Please call and notify the patient that the recent home sleep test showed obstructive sleep apnea in the moderate range. While I recommend treatment for this in the form CPAP, we are not yet bringing patients in for in-lab testing for CPAP titration studies, due to the virus pandemic; therefore, I suggest we start him on autoPAP at home, which means, that we don't have to bring him in for a sleep study with CPAP, but will let him start using an autoPAP machine at home, through a DME company (of patient's choice, or as per insurance requirement, as per in [employer], if there are such restrictions, depending on insurance carrier). The DME representative will educate the patient on how to use the machine, how to put the mask on, etc. I have placed an order in the chart. Please send referral, talk to patient, send report to referring MD. We will need a FU in sleep clinic for 10 weeks post-PAP set up, please arrange that with me or one of our NPs.  Also, please remind patient about the importance of compliance with PAP usage; this is an Designer, industrial/product, but good compliance also helps [place] track improvements in patient's sleep related complaints and objective improvements, such as BP and weight for example or nocturia or headaches, etc. For concerns and questions about how to clean the PAP machine and the supplies and how frequently to change the hose, mask and filters, etc., patient can call the DME company, for more information, education and troubleshooting. Especially in the current situation, I recommend, patients be extra mindful about hand hygiene, handling the PAP equipment only with clean hands, wipe the mask daily, keep little one and four-legged companions (and any other pets for that matter) away from the machine and mask at all times.    Thanks,   Star Age, MD, PhD [facility]  [facility])

## 2019-03-13 NOTE — Procedures (Signed)
Patient Information     First Name: Zachary Last Name: Kerr ID: [id number]  Birth Date: [birth year] Age: 43 Gender: Male  Referring Provider: Dr. Belinda Fisher BMI: 33.3 (W=207 lb, H=5' 6'')  Neck Circ.:  17 '' Epworth:  11/24   Sleep Study Information    Study Date: Mar 06, 2019 S/H/A Version: 001.001.001.001 / 4.1.1528 / 79  History: 43 year old man with a history of allergies, reflux disease and obesity, who reports snoring, excessive daytime somnolence and occasional choking sensations and sounds of choking per wife's report. Summary & Diagnosis:    OSA  Recommendations:     This home sleep test demonstrates moderate obstructive sleep apnea with a total AHI of 25.3/hour and O2 nadir of 86%. Treatment with positive airway pressure (PAP) - in the form of CPAP - is recommended. This will require, ideally, a full night CPAP titration study for proper treatment settings, O2 monitoring and mask fitting. Based on the severity of the sleep disordered breathing, an attended titration study is indicated. However, under the current circumstances (i.e. the COVID-19 pandemic), in order to ensure continuity of care and for the safety of the patient and healthcare professionals, he will be advised to proceed with an autoPAP titration/trial at home. A proper overnight, lab-attended PAP titration study with CPAP may be helpful or needed down the road to optimize treatment, when considered safe. Please note, that untreated obstructive sleep apnea may carry additional perioperative morbidity. Patients with significant obstructive sleep apnea should receive perioperative PAP therapy and the surgeons and particularly the anesthesiologist should be informed of the diagnosis and the severity of the sleep disordered breathing. The patient will be reminded regarding compliance with the PAP machine and to be mindful of cleanliness with the equipment and timely with supply changes (i.e. changing filter, mask, hose, humidifier chamber on  an ongoing basis, as recommended, and cleaning parts that touch the face and nose daily, etc). He should be cautioned not to drive, work at heights, or operate dangerous or heavy equipment when tired or sleepy. Review and reiteration of good sleep hygiene measures should be pursued with any patient. Other causes of the patient's symptoms, including circadian rhythm disturbances, an underlying mood disorder, medication effect and/or an underlying medical problem cannot be ruled out based on this test. Clinical correlation is recommended. The patient and his referring provider will be notified of the test results. The patient will be seen in follow up in sleep clinic at [facility], either for a face-to-face or virtual visit, whichever feasible and recommended at the time.  I certify that I have reviewed the raw data recording prior to the issuance of this report in accordance with the standards of the American Academy of Sleep Medicine (AASM).  Star Age, MD, PhD [facility]) Diplomat, ABPN (Neurology and Sleep)         Sleep Summary  Oxygen Saturation Statistics   Start Study Time: End Study Time: Total Recording Time:  8:16:09 PM 3:40:17 AM      7 h, 24 min  Total Sleep Time % REM of Sleep Time:  6 h, 12 min  23.2    Mean: 95 Minimum: 86 Maximum: 100  Mean of Desaturations Nadirs (%):   93  Oxygen Desaturation %:   4-9 10-20 >20 Total  Events Number Total  94 6 94.0 6.0  0 0.0  100 100.0  Oxygen Saturation: <90 <=88 <85 <80 <70  Duration (minutes): Sleep % 0.3 0.1  0.1 0.0  0.0 0.0 0.0 0.0 0.0 0.0     Respiratory Indices      Total Events REM NREM All Night  pRDI:  166  pAHI:  155 ODI:  100  pAHIc:  15  % CSR: 0.0 32.0 27.0 12.1 0.7 25.6 24.8 17.6 3.0 27.1 25.3 16.3 2.5       Pulse Rate Statistics during Sleep (BPM)      Mean: 68 Minimum: 48 Maximum: 102    Indices are calculated using technically valid sleep time of  6 hrs, 7  min. pRDI/pAHI are calculated using oxi desaturations ? 3%  Body Position Statistics  Position Supine Prone Right Left Non-Supine  Sleep (min) 210.0 95.0 10.6 57.0 162.6  Sleep % 56.4 25.5 2.8 15.3 43.6  pRDI 30.6 26.7 17.7 16.3 22.5  pAHI 30.1 22.3 17.7 14.1 19.1  ODI 21.4 8.9 23.6 8.7 9.8     Snoring Statistics Snoring Level (dB) >40 >50 >60 >70 >80 >Threshold (45)  Sleep (min) 232.2 65.0 1.3 0.0 0.0 112.8  Sleep % 62.3 17.4 0.3 0.0 0.0 30.3    Mean: 44 dB Sleep Stages Chart                                       pAHI=25.3                                                    Mild              Moderate                    Severe                                                    5              15                    30

## 2019-03-16 NOTE — Telephone Encounter (Signed)
I called pt. I advised pt that Dr. Rexene Alberts reviewed their sleep study results and found that pt has moderate osa. Dr. Rexene Alberts recommends that pt start an auto pap at home. I reviewed PAP compliance expectations with the pt. Pt is agreeable to starting an auto-PAP. I advised pt that an order will be sent to a DME, [facility], and [facility] will call the pt within about one week after they file with the pt's insurance. [facility] will show the pt how to use the machine, fit for masks, and troubleshoot the auto-PAP if needed. A follow up appt was made for insurance purposes with Amy, NP on 06/13/19 at 1:30pm. Pt verbalized understanding to arrive 15 minutes early and bring their auto-PAP. A letter with all of this information in it will be mailed to the pt as a reminder. I verified with the pt that the address we have on file is correct. Pt verbalized understanding of results. Pt had no questions at this time but was encouraged to call back if questions arise. I have sent the order to [facility] and have received confirmation that they have received the order.

## 2019-04-06 ENCOUNTER — Telehealth: Payer: Self-pay | Admitting: Internal Medicine

## 2019-04-06 NOTE — Telephone Encounter (Signed)
Zachary Kerr- please tell the Pt he needs to call his sleep medicine doctor. Thank you.

## 2019-04-06 NOTE — Telephone Encounter (Signed)
Pt need to speak to nurse concerning getting another different cpap machine. Please call pt.

## 2019-04-07 NOTE — Telephone Encounter (Signed)
Spoke with pt and pt is aware to call sleep doctor to request Cpap machine. Done

## 2019-04-10 ENCOUNTER — Encounter: Payer: Self-pay | Admitting: Family Medicine

## 2019-04-11 ENCOUNTER — Telehealth: Payer: Self-pay | Admitting: Neurology

## 2019-04-11 DIAGNOSIS — G4733 Obstructive sleep apnea (adult) (pediatric): Secondary | ICD-10-CM

## 2019-04-11 NOTE — Telephone Encounter (Signed)
Pt was set up on 03/23/2019.

## 2019-04-11 NOTE — Telephone Encounter (Signed)
Pt is calling in stating he doesn't like his cpap machine, he states the pressure is really high and when he puts it on he cant sleep

## 2019-04-11 NOTE — Telephone Encounter (Signed)
I reviewed the patient's initial compliance data, set up date was 03/23/2019.  Since his set up date he has used his AutoPap machine only 7 days with an average usage of 1 hour and 41Minutes.  His residual AHI is at goal at 1.1/h, average pressure for the 95th percentile at 11.8 cm.  Leak is on the higher side.  We can certainly try to reduce his pressure range from 4 cm to 11 cm to see if he tolerates the lower settings a little better.  Please encourage patient to keep trying his AutoPap machine and we will see him in follow-up as scheduled. Please send order for pressure change to his DME company

## 2019-04-12 NOTE — Telephone Encounter (Signed)
I called pt and discussed these recommendations with him. He is agreeable to the pressure reduction. I reminded him of compliance requirements and expectations. Pt will keep his appt in September. Pt verbalized understanding of recommendations. Order for pressure change sent to [facility] via community message. Confirmation received that the order transmitted was successful.

## 2019-06-02 ENCOUNTER — Encounter: Payer: Self-pay | Admitting: Internal Medicine

## 2019-06-02 ENCOUNTER — Other Ambulatory Visit: Payer: Self-pay

## 2019-06-02 ENCOUNTER — Ambulatory Visit (INDEPENDENT_AMBULATORY_CARE_PROVIDER_SITE_OTHER): Payer: Managed Care, Other (non HMO) | Admitting: Internal Medicine

## 2019-06-02 VITALS — BP 120/80 | HR 79 | Temp 98.3°F | Resp 16 | Ht 66.0 in | Wt 207.4 lb

## 2019-06-02 DIAGNOSIS — R945 Abnormal results of liver function studies: Secondary | ICD-10-CM

## 2019-06-02 DIAGNOSIS — G4733 Obstructive sleep apnea (adult) (pediatric): Secondary | ICD-10-CM | POA: Diagnosis not present

## 2019-06-02 DIAGNOSIS — M25512 Pain in left shoulder: Secondary | ICD-10-CM

## 2019-06-02 DIAGNOSIS — Z23 Encounter for immunization: Secondary | ICD-10-CM

## 2019-06-02 DIAGNOSIS — Z9989 Dependence on other enabling machines and devices: Secondary | ICD-10-CM

## 2019-06-02 DIAGNOSIS — R7989 Other specified abnormal findings of blood chemistry: Secondary | ICD-10-CM

## 2019-06-02 LAB — HEPATIC FUNCTION PANEL
ALT: 51 U/L (ref 0–53)
AST: 26 U/L (ref 0–37)
Albumin: 4.2 g/dL (ref 3.5–5.2)
Alkaline Phosphatase: 58 U/L (ref 39–117)
Bilirubin, Direct: 0.1 mg/dL (ref 0.0–0.3)
Total Bilirubin: 0.9 mg/dL (ref 0.2–1.2)
Total Protein: 7.2 g/dL (ref 6.0–8.3)

## 2019-06-02 NOTE — Assessment & Plan Note (Signed)
OSA: Having issues adapting to the mask, benefits of CPAP discussed with the patient, encouraged to continue trying increase LFTs: Mild, not taking Tylenol, no EtOH, will recheck LFTs and check hepatitis serologies Shoulder pain: Going on for a while, patient is ready for some treatment, refer to Ortho Preventive care: Flu shot today RTC 6 months

## 2019-06-02 NOTE — Progress Notes (Signed)
Pre visit review using our clinic review tool, if applicable. No additional management support is needed unless otherwise documented below in the visit note. 

## 2019-06-02 NOTE — Patient Instructions (Signed)
Please go to the lab.  Next visit in 6 months for a physical exam, fasting.   We are referring you to orthopedic for your shoulder pain

## 2019-06-02 NOTE — Progress Notes (Signed)
Subjective:    Patient ID: Zachary Kerr, male    DOB: 01/29/1976, 43 y.o.   MRN: [id number]  DOS:  06/02/2019 Type of visit - description: rov Since the last office visit was diagnosed with OSA Increased LFTs: Does not take Tylenol regularly, has occasionally alcohol but not daily or heavy. Left shoulder pain, on and off for a while, increased with lifting    Review of Systems   Past Medical History:  Diagnosis Date  . Environmental allergies   . GERD (gastroesophageal reflux disease)     Past Surgical History:  Procedure Laterality Date  . ROTATOR CUFF REPAIR Left 2010    Social History   Socioeconomic History  . Marital status: Married    Spouse name: Not on file  . Number of children: 3  . Years of education: Not on file  . Highest education level: Not on file  Occupational History  . Occupation: Freight forwarder   Social Needs  . Financial resource strain: Not on file  . Food insecurity    Worry: Not on file    Inability: Not on file  . Transportation needs    Medical: Not on file    Non-medical: Not on file  Tobacco Use  . Smoking status: Former Research scientist (life sciences)  . Smokeless tobacco: Never Used  Substance and Sexual Activity  . Alcohol use: Yes    Comment: socially   . Drug use: No  . Sexual activity: Not on file  Lifestyle  . Physical activity    Days per week: Not on file    Minutes per session: Not on file  . Stress: Not on file  Relationships  . Social Herbalist on phone: Not on file    Gets together: Not on file    Attends religious service: Not on file    Active member of club or organization: Not on file    Attends meetings of clubs or organizations: Not on file    Relationship status: Not on file  . Intimate partner violence    Fear of current or ex partner: Not on file    Emotionally abused: Not on file    Physically abused: Not on file    Forced sexual activity: Not on file  Other Topics Concern  . Not on file  Social History  Narrative   From [place]             Allergies as of 06/02/2019   No Known Allergies     Medication List       Accurate as of June 02, 2019  8:52 AM. If you have any questions, ask your nurse or doctor.        hydrocortisone 25 MG suppository Commonly known as: ANUSOL-HC Place 1 suppository (25 mg total) rectally 2 (two) times daily as needed for hemorrhoids or anal itching.           Objective:   Physical Exam BP 120/80 (BP Location: Left Arm, Patient Position: Sitting, Cuff Size: Normal)   Pulse 79   Temp 98.3 F (36.8 C) (Temporal)   Resp 16   Ht 5\' 6"  (1.676 m)   Wt 207 lb 6 oz (94.1 kg)   SpO2 98%   BMI 33.47 kg/m  General:   Well developed, NAD, BMI noted. HEENT:  Normocephalic . Face symmetric, atraumatic MSK:  Right shoulder normal Left shoulder: Symmetric, range of motion normal Skin: Not pale. Not jaundice Neurologic:  alert & oriented  X3.  Speech normal, gait appropriate for age and unassisted Psych--  Cognition and judgment appear intact.  Cooperative with normal attention span and concentration.  Behavior appropriate. No anxious or depressed appearing.      Assessment    Assessment GERD OSA DX 2020 Internal hemorrhoids  Plan: OSA: Having issues adapting to the mask, benefits of CPAP discussed with the patient, encouraged to continue trying increase LFTs: Mild, not taking Tylenol, no EtOH, will recheck LFTs and check hepatitis serologies Shoulder pain: Going on for a while, patient is ready for some treatment, refer to Ortho Preventive care: Flu shot today RTC 6 months

## 2019-06-06 LAB — HEPATITIS B SURFACE ANTIGEN: Hepatitis B Surface Ag: NONREACTIVE

## 2019-06-06 LAB — HEPATITIS C ANTIBODY
Hepatitis C Ab: NONREACTIVE
SIGNAL TO CUT-OFF: 0.04 (ref <1.00)

## 2019-06-06 LAB — TEST AUTHORIZATION

## 2019-06-06 LAB — HEPATITIS B CORE ANTIBODY, TOTAL: Hep B Core Total Ab: NONREACTIVE

## 2019-06-06 LAB — HEPATITIS B SURFACE ANTIBODY,QUALITATIVE: Hep B S Ab: REACTIVE — AB

## 2019-06-13 ENCOUNTER — Other Ambulatory Visit: Payer: Self-pay

## 2019-06-13 ENCOUNTER — Ambulatory Visit (INDEPENDENT_AMBULATORY_CARE_PROVIDER_SITE_OTHER): Payer: Managed Care, Other (non HMO) | Admitting: Family Medicine

## 2019-06-13 ENCOUNTER — Encounter: Payer: Self-pay | Admitting: Family Medicine

## 2019-06-13 VITALS — BP 118/87 | HR 72 | Temp 98.0°F | Ht 66.0 in | Wt 209.2 lb

## 2019-06-13 DIAGNOSIS — Z9989 Dependence on other enabling machines and devices: Secondary | ICD-10-CM

## 2019-06-13 DIAGNOSIS — G4733 Obstructive sleep apnea (adult) (pediatric): Secondary | ICD-10-CM | POA: Diagnosis not present

## 2019-06-13 NOTE — Patient Instructions (Signed)
Continue CPAP nightly and for greater than 4 hours each night  Follow up in 1 year, sooner if needed  Sleep Apnea Sleep apnea affects breathing during sleep. It causes breathing to stop for a short time or to become shallow. It can also increase the risk of:  Heart attack.  Stroke.  Being very overweight (obese).  Diabetes.  Heart failure.  Irregular heartbeat. The goal of treatment is to help you breathe normally again. What are the causes? There are three kinds of sleep apnea:  Obstructive sleep apnea. This is caused by a blocked or collapsed airway.  Central sleep apnea. This happens when the brain does not send the right signals to the muscles that control breathing.  Mixed sleep apnea. This is a combination of obstructive and central sleep apnea. The most common cause of this condition is a collapsed or blocked airway. This can happen if:  Your throat muscles are too relaxed.  Your tongue and tonsils are too large.  You are overweight.  Your airway is too small. What increases the risk?  Being overweight.  Smoking.  Having a small airway.  Being older.  Being male.  Drinking alcohol.  Taking medicines to calm yourself (sedatives or tranquilizers).  Having family members with the condition. What are the signs or symptoms?  Trouble staying asleep.  Being sleepy or tired during the day.  Getting angry a lot.  Loud snoring.  Headaches in the morning.  Not being able to focus your mind (concentrate).  Forgetting things.  Less interest in sex.  Mood swings.  Personality changes.  Feelings of sadness (depression).  Waking up a lot during the night to pee (urinate).  Dry mouth.  Sore throat. How is this diagnosed?  Your medical history.  A physical exam.  A test that is done when you are sleeping (sleep study). The test is most often done in a sleep lab but may also be done at home. How is this treated?   Sleeping on your  side.  Using a medicine to get rid of mucus in your nose (decongestant).  Avoiding the use of alcohol, medicines to help you relax, or certain pain medicines (narcotics).  Losing weight, if needed.  Changing your diet.  Not smoking.  Using a machine to open your airway while you sleep, such as: ? An oral appliance. This is a mouthpiece that shifts your lower jaw forward. ? A CPAP device. This device blows air through a mask when you breathe out (exhale). ? An EPAP device. This has valves that you put in each nostril. ? A BPAP device. This device blows air through a mask when you breathe in (inhale) and breathe out.  Having surgery if other treatments do not work. It is important to get treatment for sleep apnea. Without treatment, it can lead to:  High blood pressure.  Coronary artery disease.  In men, not being able to have an erection (impotence).  Reduced thinking ability. Follow these instructions at home: Lifestyle  Make changes that your doctor recommends.  Eat a healthy diet.  Lose weight if needed.  Avoid alcohol, medicines to help you relax, and some pain medicines.  Do not use any products that contain nicotine or tobacco, such as cigarettes, e-cigarettes, and chewing tobacco. If you need help quitting, ask your doctor. General instructions  Take over-the-counter and prescription medicines only as told by your doctor.  If you were given a machine to use while you sleep, use it only as   told by your doctor.  If you are having surgery, make sure to tell your doctor you have sleep apnea. You may need to bring your device with you.  Keep all follow-up visits as told by your doctor. This is important. Contact a doctor if:  The machine that you were given to use during sleep bothers you or does not seem to be working.  You do not get better.  You get worse. Get help right away if:  Your chest hurts.  You have trouble breathing in enough air.  You have  an uncomfortable feeling in your back, arms, or stomach.  You have trouble talking.  One side of your body feels weak.  A part of your face is hanging down. These symptoms may be an emergency. Do not wait to see if the symptoms will go away. Get medical help right away. Call your local emergency services (911 in the [place] Do not drive yourself to the hospital. Summary  This condition affects breathing during sleep.  The most common cause is a collapsed or blocked airway.  The goal of treatment is to help you breathe normally while you sleep. This information is not intended to replace advice given to you by your health care provider. Make sure you discuss any questions you have with your health care provider. Document Released: 06/23/2008 Document Revised: 07/01/2018 Document Reviewed: 05/10/2018 Elsevier Patient Education  2020 Elsevier Inc. CPAP and BPAP Information CPAP and BPAP are methods of helping a person breathe with the use of air pressure. CPAP stands for "continuous positive airway pressure." BPAP stands for "bi-level positive airway pressure." In both methods, air is blown through your nose or mouth and into your air passages to help you breathe well. CPAP and BPAP use different amounts of pressure to blow air. With CPAP, the amount of pressure stays the same while you breathe in and out. With BPAP, the amount of pressure is increased when you breathe in (inhale) so that you can take larger breaths. Your health care provider will recommend whether CPAP or BPAP would be more helpful for you. Why are CPAP and BPAP treatments used? CPAP or BPAP can be helpful if you have:  Sleep apnea.  Chronic obstructive pulmonary disease (COPD).  Heart failure.  Medical conditions that weaken the muscles of the chest including muscular dystrophy, or neurological diseases such as amyotrophic lateral sclerosis (ALS).  Other problems that cause breathing to be weak, abnormal, or difficult.  CPAP is most commonly used for obstructive sleep apnea (OSA) to keep the airways from collapsing when the muscles relax during sleep. How is CPAP or BPAP administered? Both CPAP and BPAP are provided by a small machine with a flexible plastic tube that attaches to a plastic mask. You wear the mask. Air is blown through the mask into your nose or mouth. The amount of pressure that is used to blow the air can be adjusted on the machine. Your health care provider will determine the pressure setting that should be used based on your individual needs. When should CPAP or BPAP be used? In most cases, the mask only needs to be worn during sleep. Generally, the mask needs to be worn throughout the night and during any daytime naps. People with certain medical conditions may also need to wear the mask at other times when they are awake. Follow instructions from your health care provider about when to use the machine. What are some tips for using the mask?   Because   the mask needs to be snug, some people feel trapped or closed-in (claustrophobic) when first using the mask. If you feel this way, you may need to get used to the mask. One way to do this is by holding the mask loosely over your nose or mouth and then gradually applying the mask more snugly. You can also gradually increase the amount of time that you use the mask.  Masks are available in various types and sizes. Some fit over your mouth and nose while others fit over just your nose. If your mask does not fit well, talk with your health care provider about getting a different one.  If you are using a mask that fits over your nose and you tend to breathe through your mouth, a chin strap may be applied to help keep your mouth closed.  The CPAP and BPAP machines have alarms that may sound if the mask comes off or develops a leak.  If you have trouble with the mask, it is very important that you talk with your health care provider about finding a way  to make the mask easier to tolerate. Do not stop using the mask. Stopping the use of the mask could have a negative impact on your health. What are some tips for using the machine?  Place your CPAP or BPAP machine on a secure table or stand near an electrical outlet.  Know where the on/off switch is located on the machine.  Follow instructions from your health care provider about how to set the pressure on your machine and when you should use it.  Do not eat or drink while the CPAP or BPAP machine is on. Food or fluids could get pushed into your lungs by the pressure of the CPAP or BPAP.  Do not smoke. Tobacco smoke residue can damage the machine.  For home use, CPAP and BPAP machines can be rented or purchased through home health care companies. Many different brands of machines are available. Renting a machine before purchasing may help you find out which particular machine works well for you.  Keep the CPAP or BPAP machine and attachments clean. Ask your health care provider for specific instructions. Get help right away if:  You have redness or open areas around your nose or mouth where the mask fits.  You have trouble using the CPAP or BPAP machine.  You cannot tolerate wearing the CPAP or BPAP mask.  You have pain, discomfort, and bloating in your abdomen. Summary  CPAP and BPAP are methods of helping a person breathe with the use of air pressure.  Both CPAP and BPAP are provided by a small machine with a flexible plastic tube that attaches to a plastic mask.  If you have trouble with the mask, it is very important that you talk with your health care provider about finding a way to make the mask easier to tolerate. This information is not intended to replace advice given to you by your health care provider. Make sure you discuss any questions you have with your health care provider. Document Released: 06/12/2004 Document Revised: 01/04/2019 Document Reviewed: 08/03/2016  Elsevier Patient Education  2020 Elsevier Inc.  

## 2019-06-13 NOTE — Progress Notes (Addendum)
PATIENT: Zachary Kerr DOB: August 11, 1976  REASON FOR VISIT: follow up HISTORY FROM: patient  Chief Complaint  Patient presents with  . Follow-up    Initial auto pap f/u. Alone. Rm 2. Patient mentioned that the pressure is too high and it hurts his nose. He would like to discuss getting a new one.      HISTORY OF PRESENT ILLNESS: Today 06/13/19 Zachary Kerr is a 43 y.o. male here today for initial follow up of OSA on CPAP.  He reports that overall he is doing well with CPAP therapy.  He initially started with a fullface mask that he did not feel was comfortable.  He has recently switched to a nasal mask.  He does note that the pressure seems to be increased with the nasal mask.  He feels that he has adjusted somewhat with time.  He feels that this mask is definitely better for him and wishes to continue.  Compliance report dated 05/13/2019 through 06/11/2019 reveals that he is using CPAP 29 out of the last 30 days for compliance of 97%.  21 of those days he used CPAP greater than 4 hours for compliance of 70%.  Average usage was 5 hours and 9 minutes.  AHI was 1.6 on 4 to 11 cm of water and an EPR of 2.  There was no significant leak noted.  HISTORY: (copied from Dr Guadelupe Sabin note on 01/24/2019)  Mr. Zachary Kerr is a 43 year old right-handed gentleman with an underlying medical history of allergies, reflux disease, with whom I am conducting a virtual, video based new patient visit via [email or web address] in lieu of a face-to-face visit for evaluation of his sleep disorder, in particular, evaluation for obstructive sleep apnea. The patient is unaccompanied today and joins via cell phone from home and I also called his phone as his audio through [email or web address] was not working, although he could hear me through it. He is referred by his primary care physician, Dr. Belinda Fisher and I reviewed his note from 11/29/2018. He reports snoring, excessive daytime somnolence and occasional choking sensations and sounds of choking per  wife's report. He lives with his wife and 3 children. He works for [employer] as a Freight forwarder, currently working from [employer] AM to 12 noon. Bedtime is around 9 and rise time around 2:30 currently. He does not have night to night nocturia and denies morning headaches. He does not always feel rested and does get tired when he comes home from work. Specially when sedentary he will have a tendency to fall asleep. He drinks caffeine in the form of coffee, 2 cups in the morning and one energy drink in the afternoon. He drinks alcohol occasionally, he is a nonsmoker. They have 1 dog in the household but not in the bedroom at night, he does not watch TV in his bedroom. His weight has been fluctuating. When he tried to lose weight he was able to lose some, he is currently back at 200. His Epworth sleepiness score is 11 out of 24, fatigue score is 27 out of 63.    REVIEW OF SYSTEMS: Out of a complete 14 system review of symptoms, the patient complains only of the following symptoms, daytime sleepiness and all other reviewed systems are negative.  ALLERGIES: No Known Allergies  HOME MEDICATIONS: Outpatient Medications Prior to Visit  Medication Sig Dispense Refill  . hydrocortisone (ANUSOL-HC) 25 MG suppository Place 1 suppository (25 mg total) rectally 2 (two) times daily as needed for hemorrhoids or  anal itching. (Patient not taking: Reported on 06/02/2019) 12 suppository 1   No facility-administered medications prior to visit.     PAST MEDICAL HISTORY: Past Medical History:  Diagnosis Date  . Environmental allergies   . GERD (gastroesophageal reflux disease)     PAST SURGICAL HISTORY: Past Surgical History:  Procedure Laterality Date  . ROTATOR CUFF REPAIR Left 2010    FAMILY HISTORY: Family History  Problem Relation Age of Onset  . Alcohol abuse Father        GF  . Benign prostatic hyperplasia Other        auncle   . Diabetes Neg Hx   . Hypertension Neg Hx   . Heart attack Neg Hx   .  Colon cancer Neg Hx   . Prostate cancer Neg Hx     SOCIAL HISTORY: Social History   Socioeconomic History  . Marital status: Married    Spouse name: Not on file  . Number of children: 3  . Years of education: Not on file  . Highest education level: Not on file  Occupational History  . Occupation: Estate agent   Social Needs  . Financial resource strain: Not on file  . Food insecurity    Worry: Not on file    Inability: Not on file  . Transportation needs    Medical: Not on file    Non-medical: Not on file  Tobacco Use  . Smoking status: Former Games developer  . Smokeless tobacco: Never Used  Substance and Sexual Activity  . Alcohol use: Yes    Comment: socially   . Drug use: No  . Sexual activity: Not on file  Lifestyle  . Physical activity    Days per week: Not on file    Minutes per session: Not on file  . Stress: Not on file  Relationships  . Social Musician on phone: Not on file    Gets together: Not on file    Attends religious service: Not on file    Active member of club or organization: Not on file    Attends meetings of clubs or organizations: Not on file    Relationship status: Not on file  . Intimate partner violence    Fear of current or ex partner: Not on file    Emotionally abused: Not on file    Physically abused: Not on file    Forced sexual activity: Not on file  Other Topics Concern  . Not on file  Social History Narrative   [place]             PHYSICAL EXAM  Vitals:   06/13/19 1319  BP: 118/87  Pulse: 72  Temp: 98 F (36.7 C)  TempSrc: Oral  Weight: 209 lb 3.2 oz (94.9 kg)  Height: 5\' 6"  (1.676 m)   Body mass index is 33.77 kg/m.  Generalized: Well developed, in no acute distress  Neurological examination  Mentation: Alert oriented to time, place, history taking. Follows all commands speech and language fluent Cranial nerve II-XII: Pupils were equal round reactive to light. Extraocular movements were  full, visual field were full on confrontational test. Facial sensation and strength were normal. Uvula tongue midline. Head turning and shoulder shrug  were normal and symmetric. Motor: The motor testing reveals 5 over 5 strength of all 4 extremities. Good symmetric motor tone is noted throughout.  Sensory: Sensory testing is intact to soft touch on all 4 extremities. No evidence of extinction  is noted.  Coordination: Cerebellar testing reveals good finger-nose-finger and heel-to-shin bilaterally.  Gait and station: Gait is normal.    DIAGNOSTIC DATA (LABS, IMAGING, TESTING) - I reviewed patient records, labs, notes, testing and imaging myself where available.  No flowsheet data found.   Lab Results  Component Value Date   WBC 6.2 11/29/2018   HGB 15.5 11/29/2018   HCT 45.0 11/29/2018   MCV 90.5 11/29/2018   PLT 320.0 11/29/2018      Component Value Date/Time   NA 138 11/29/2018 1629   K 4.1 11/29/2018 1629   CL 104 11/29/2018 1629   CO2 26 11/29/2018 1629   GLUCOSE 94 11/29/2018 1629   BUN 12 11/29/2018 1629   CREATININE 0.95 11/29/2018 1629   CALCIUM 9.7 11/29/2018 1629   PROT 7.2 06/02/2019 0909   ALBUMIN 4.2 06/02/2019 0909   AST 26 06/02/2019 0909   ALT 51 06/02/2019 0909   ALKPHOS 58 06/02/2019 0909   BILITOT 0.9 06/02/2019 0909   GFRNONAA 101.13 09/18/2010 0952   Lab Results  Component Value Date   CHOL 190 11/29/2018   HDL 58.90 11/29/2018   LDLCALC 97 11/29/2018   LDLDIRECT 136.2 04/19/2013   TRIG 172.0 (H) 11/29/2018   CHOLHDL 3 11/29/2018   Lab Results  Component Value Date   HGBA1C 5.6 11/29/2018   No results found for: VITAMINB12 Lab Results  Component Value Date   TSH 1.95 11/29/2018       ASSESSMENT AND PLAN 43 y.o. year old male  has a past medical history of Environmental allergies and GERD (gastroesophageal reflux disease). here with     ICD-10-CM   1. OSA on CPAP  G47.33    Z99.89     Compliance report reveals optimal compliance.   We have discussed the importance of continuing CPAP nightly and for greater than 4 hours each night.  He wishes to continue with the current mask.  I have advised that he may reach out to his DME company if he wishes to change mask.  I have reviewed compliance data with him and that.  Risk factors of untreated sleep apnea discussed.  We will follow-up in 1 year, sooner if needed.  He verbalizes understanding and agreement with this plan.   No orders of the defined types were placed in this encounter.    No orders of the defined types were placed in this encounter.     I spent 15 minutes with the patient. 50% of this time was spent counseling and educating patient on plan of care and medications.    Shawnie Dappermy Brittanni Cariker, FNP-C 06/13/2019, 1:46 PM [facility] [address], [address] [place], [place] [id number] [phone]  I reviewed the above note and documentation by the Nurse Practitioner and agree with the history, exam, assessment and plan as outlined above. I was immediately available for consultation. Huston FoleySaima Athar, MD, PhD [facility]

## 2019-10-25 ENCOUNTER — Telehealth: Payer: Self-pay

## 2019-10-25 ENCOUNTER — Encounter: Payer: Self-pay | Admitting: Internal Medicine

## 2019-10-25 ENCOUNTER — Ambulatory Visit (INDEPENDENT_AMBULATORY_CARE_PROVIDER_SITE_OTHER): Payer: Managed Care, Other (non HMO) | Admitting: Internal Medicine

## 2019-10-25 DIAGNOSIS — R05 Cough: Secondary | ICD-10-CM

## 2019-10-25 DIAGNOSIS — R059 Cough, unspecified: Secondary | ICD-10-CM

## 2019-10-25 DIAGNOSIS — U071 COVID-19: Secondary | ICD-10-CM | POA: Diagnosis not present

## 2019-10-25 NOTE — Progress Notes (Signed)
   Subjective:    Patient ID: Zachary Kerr, male    DOB: May 08, 1976, 44 y.o.   MRN: [id number]  DOS:  10/25/2019 Type of visit - description: Virtual Visit via Video Note  I connected with the above patient  by a video enabled telemedicine application and verified that I am speaking with the correct person using two identifiers.   THIS ENCOUNTER IS A VIRTUAL VISIT DUE TO COVID-19 - PATIENT WAS NOT SEEN IN THE OFFICE. PATIENT HAS CONSENTED TO VIRTUAL VISIT / TELEMEDICINE VISIT   Location of patient: home  Location of provider: office  I discussed the limitations of evaluation and management by telemedicine and the availability of in person appointments. The patient expressed understanding and agreed to proceed.  Acute Sx started 10-14-2019 Tested Covid + 2 days later Initially sx were runny nose and myalgias 6 days ago started w/  Cough, fever, mostly at night. No chest pain, some fatigue and mild DOE. He has not checked his temperature but he felt feverish. Reportedly went to urgent care and was prescribed Z-Pak x2 and prednisone. He is already finishing those medicines.   Review of Systems Denies nausea vomiting. Had mild diarrhea No ambulatory BPs or pulse oximeter.  Past Medical History:  Diagnosis Date  . Environmental allergies   . GERD (gastroesophageal reflux disease)     Past Surgical History:  Procedure Laterality Date  . ROTATOR CUFF REPAIR Left 2010        Objective:   Physical Exam There were no vitals taken for this visit. This is a virtual video visit, he is alert oriented x3, no apparent distress, minimal cough noted, no chest congestion    Assessment     Assessment GERD OSA DX 2020 Internal hemorrhoids  Plan: COVID-19: Symptoms of started 10/14/2019. He is still running fevers at night, subjective, has not checked.  He also c/o  cough with occasional yellow sputum. He already was prescribed Z-Pak x2 and prednisone at a urgent care. Nontoxic  appearing. Plan: Chest x-ray Tylenol 500 mg 2 tablets every 8 hours as needed for fever Mucinex DM as needed for cough Unable to go to work until fever free for 3 days without medications.  We will send a work note, will be off for 1 additional week, could be sooner if he feels better. Indications for ER or further evaluation: High fever, not improving in few days, chest pain, shortness of breath, feeling much worse. Ideally recommend to check BPs -  O2 sat to be sure is more than 95% however he does not think he will be able to get the appropriate devices. Of note is, wife and daughter at home are also Covid positive.     I discussed the assessment and treatment plan with the patient. The patient was provided an opportunity to ask questions and all were answered. The patient agreed with the plan and demonstrated an understanding of the instructions.   The patient was advised to call back or seek an in-person evaluation if the symptoms worsen or if the condition fails to improve as anticipated.

## 2019-10-25 NOTE — Telephone Encounter (Signed)
Work note faxed to [employer] at [phone]. Received fax confirmation.

## 2019-10-26 ENCOUNTER — Telehealth: Payer: Self-pay

## 2019-10-26 ENCOUNTER — Other Ambulatory Visit: Payer: Self-pay

## 2019-10-26 ENCOUNTER — Ambulatory Visit (HOSPITAL_BASED_OUTPATIENT_CLINIC_OR_DEPARTMENT_OTHER)
Admission: RE | Admit: 2019-10-26 | Discharge: 2019-10-26 | Disposition: A | Discharge status: Home / Self Care | Payer: Managed Care, Other (non HMO) | Source: Ambulatory Visit | Attending: Internal Medicine | Admitting: Internal Medicine

## 2019-10-26 DIAGNOSIS — J9811 Atelectasis: Secondary | ICD-10-CM | POA: Insufficient documentation

## 2019-10-26 DIAGNOSIS — R05 Cough: Secondary | ICD-10-CM | POA: Insufficient documentation

## 2019-10-26 DIAGNOSIS — R0689 Other abnormalities of breathing: Secondary | ICD-10-CM | POA: Insufficient documentation

## 2019-10-26 DIAGNOSIS — U071 COVID-19: Secondary | ICD-10-CM | POA: Insufficient documentation

## 2019-10-26 NOTE — Telephone Encounter (Signed)
Work note faxed again and emailed to email provided. Pt informed.

## 2019-10-26 NOTE — Telephone Encounter (Signed)
Patient called in to see if Dr. Drue Novel can resend his return back to work paperwork to his job. Patient has provided and e-mail to send it to as well [email or web address] Please follow up with the patient at [phone] thanks.

## 2019-10-26 NOTE — Assessment & Plan Note (Signed)
COVID-19: Symptoms of started 10/14/2019. He is still running fevers at night, subjective, has not checked.  He also c/o  cough with occasional yellow sputum. He already was prescribed Z-Pak x2 and prednisone at a urgent care. Nontoxic appearing. Plan: Chest x-ray Tylenol 500 mg 2 tablets every 8 hours as needed for fever Mucinex DM as needed for cough Unable to go to work until fever free for 3 days without medications.  We will send a work note, will be off for 1 additional week, could be sooner if he feels better. Indications for ER or further evaluation: High fever, not improving in few days, chest pain, shortness of breath, feeling much worse. Ideally recommend to check BPs -  O2 sat to be sure is more than 95% however he does not think he will be able to get the appropriate devices. Of note is, wife and daughter at home are also Covid positive.

## 2019-12-08 ENCOUNTER — Encounter: Payer: Managed Care, Other (non HMO) | Admitting: Internal Medicine

## 2019-12-28 DIAGNOSIS — M7542 Impingement syndrome of left shoulder: Secondary | ICD-10-CM | POA: Insufficient documentation

## 2019-12-28 DIAGNOSIS — M75112 Incomplete rotator cuff tear or rupture of left shoulder, not specified as traumatic: Secondary | ICD-10-CM | POA: Insufficient documentation

## 2020-01-12 ENCOUNTER — Encounter: Payer: 59 | Admitting: Internal Medicine

## 2020-01-18 ENCOUNTER — Ambulatory Visit (INDEPENDENT_AMBULATORY_CARE_PROVIDER_SITE_OTHER): Payer: 59 | Admitting: Internal Medicine

## 2020-01-18 ENCOUNTER — Other Ambulatory Visit: Payer: Self-pay

## 2020-01-18 ENCOUNTER — Encounter: Payer: Self-pay | Admitting: Internal Medicine

## 2020-01-18 VITALS — BP 100/67 | HR 73 | Temp 98.2°F | Resp 16 | Ht 66.0 in | Wt 199.1 lb

## 2020-01-18 DIAGNOSIS — Z23 Encounter for immunization: Secondary | ICD-10-CM | POA: Diagnosis not present

## 2020-01-18 DIAGNOSIS — Z0000 Encounter for general adult medical examination without abnormal findings: Secondary | ICD-10-CM | POA: Diagnosis not present

## 2020-01-18 LAB — CBC WITH DIFFERENTIAL/PLATELET
Basophils Absolute: 0 10*3/uL (ref 0.0–0.1)
Basophils Relative: 0.5 % (ref 0.0–3.0)
Eosinophils Absolute: 0.2 10*3/uL (ref 0.0–0.7)
Eosinophils Relative: 3.3 % (ref 0.0–5.0)
HCT: 44.4 % (ref 39.0–52.0)
Hemoglobin: 15.1 g/dL (ref 13.0–17.0)
Lymphocytes Relative: 37.6 % (ref 12.0–46.0)
Lymphs Abs: 1.8 10*3/uL (ref 0.7–4.0)
MCHC: 33.9 g/dL (ref 30.0–36.0)
MCV: 91.7 fl (ref 78.0–100.0)
Monocytes Absolute: 0.5 10*3/uL (ref 0.1–1.0)
Monocytes Relative: 10.4 % (ref 3.0–12.0)
Neutro Abs: 2.4 10*3/uL (ref 1.4–7.7)
Neutrophils Relative %: 48.2 % (ref 43.0–77.0)
Platelets: 292 10*3/uL (ref 150.0–400.0)
RBC: 4.84 Mil/uL (ref 4.22–5.81)
RDW: 14 % (ref 11.5–15.5)
WBC: 4.9 10*3/uL (ref 4.0–10.5)

## 2020-01-18 LAB — COMPREHENSIVE METABOLIC PANEL
ALT: 35 U/L (ref 0–53)
AST: 19 U/L (ref 0–37)
Albumin: 4.3 g/dL (ref 3.5–5.2)
Alkaline Phosphatase: 56 U/L (ref 39–117)
BUN: 17 mg/dL (ref 6–23)
CO2: 27 mEq/L (ref 19–32)
Calcium: 9.4 mg/dL (ref 8.4–10.5)
Chloride: 103 mEq/L (ref 96–112)
Creatinine, Ser: 0.99 mg/dL (ref 0.40–1.50)
GFR: 81.4 mL/min (ref >60.00)
Glucose, Bld: 88 mg/dL (ref 70–99)
Potassium: 4.2 mEq/L (ref 3.5–5.1)
Sodium: 137 mEq/L (ref 135–145)
Total Bilirubin: 0.8 mg/dL (ref 0.2–1.2)
Total Protein: 7.1 g/dL (ref 6.0–8.3)

## 2020-01-18 LAB — LIPID PANEL
Cholesterol: 222 mg/dL — ABNORMAL HIGH (ref 0–200)
HDL: 69.1 mg/dL (ref >39.00)
LDL Cholesterol: 130 mg/dL — ABNORMAL HIGH (ref 0–99)
NonHDL: 152.83
Total CHOL/HDL Ratio: 3
Triglycerides: 113 mg/dL (ref 0.0–149.0)
VLDL: 22.6 mg/dL (ref 0.0–40.0)

## 2020-01-18 NOTE — Progress Notes (Signed)
Pre visit review using our clinic review tool, if applicable. No additional management support is needed unless otherwise documented below in the visit note. 

## 2020-01-18 NOTE — Patient Instructions (Signed)
   GO TO THE LAB : Get the blood work     GO TO THE FRONT DESK, PLEASE SCHEDULE YOUR APPOINTMENTS Come back for a physical exam in 1 year 

## 2020-01-18 NOTE — Progress Notes (Signed)
   Subjective:    Patient ID: Zachary Kerr, male    DOB: 01-Feb-1976, 44 y.o.   MRN: [id number]  DOS:  01/18/2020 Type of visit - description: CPX Since the last office visit he is doing well.  Wt Readings from Last 3 Encounters:  01/18/20 199 lb 2 oz (90.3 kg)  06/13/19 209 lb 3.2 oz (94.9 kg)  06/02/19 207 lb 6 oz (94.1 kg)     Review of Systems  A 14 point review of systems is negative     Past Medical History:  Diagnosis Date  . Environmental allergies   . GERD (gastroesophageal reflux disease)     Past Surgical History:  Procedure Laterality Date  . ROTATOR CUFF REPAIR Left 2010   Family History  Problem Relation Age of Onset  . Alcohol abuse Father        GF  . Benign prostatic hyperplasia Other        auncle   . Diabetes Neg Hx   . Hypertension Neg Hx   . Heart attack Neg Hx   . Colon cancer Neg Hx   . Prostate cancer Neg Hx      Allergies as of 01/18/2020   No Known Allergies     Medication List    as of January 18, 2020 11:59 PM   You have not been prescribed any medications.        Objective:   Physical Exam BP 100/67 (BP Location: Right Arm, Patient Position: Sitting, Cuff Size: Normal)   Pulse 73   Temp 98.2 F (36.8 C) (Temporal)   Resp 16   Ht 5\' 6"  (1.676 m)   Wt 199 lb 2 oz (90.3 kg)   SpO2 98%   BMI 32.14 kg/m  General: Well developed, NAD, BMI noted Neck: No  thyromegaly  HEENT:  Normocephalic . Face symmetric, atraumatic Lungs:  CTA B Normal respiratory effort, no intercostal retractions, no accessory muscle use. Heart: RRR,  no murmur.  Abdomen:  Not distended, soft, non-tender. No rebound or rigidity.   Lower extremities: no pretibial edema bilaterally  Skin: Exposed areas without rash. Not pale. Not jaundice Neurologic:  alert & oriented X3.  Speech normal, gait appropriate for age and unassisted Strength symmetric and appropriate for age.  Psych: Cognition and judgment appear intact.  Cooperative with normal  attention span and concentration.  Behavior appropriate. No anxious or depressed appearing.     Assessment     Assessment GERD OSA DX 2020 Internal hemorrhoids  Plan: Here for CPX COVID-19, DX 09-2019, feels completely recuperated Internal hemorrhoids: Occasional mild blood per rectum. OSA: Reports good compliance with CPAP Allergies: Seasonal, to pollen, recommend OTCs and when he is working outdoors to use goggles and mask. Increase LFTs: Noted on chart review, rechecking today. RTC 1 year    This visit occurred during the SARS-CoV-2 public health emergency.  Safety protocols were in place, including screening questions prior to the visit, additional usage of staff PPE, and extensive cleaning of exam room while observing appropriate contact time as indicated for disinfecting solutions.

## 2020-01-20 NOTE — Assessment & Plan Note (Signed)
-  Td 2011, booster today -Covid vaccination, patient is hesitant, benefits discussed, he will think about it.  If proceed, needs to be 2 weeks apart from Tdap booster he is getting today -CCS: No family history of colon cancer.    3 options discussed, elected IFOBr (recommend to take samples the days doesn't have  Hemorrhoid sxs - Prostate cancer screening: Not indicated -Diet and exercise discussed: He is doing well, has lost few pounds, encouraged to continue that path -Labs CMP, FLP, CBC, I fob

## 2020-01-20 NOTE — Assessment & Plan Note (Signed)
Here for CPX COVID-19, DX 09-2019, feels completely recuperated Internal hemorrhoids: Occasional mild blood per rectum. OSA: Reports good compliance with CPAP Allergies: Seasonal, to pollen, recommend OTCs and when he is working outdoors to use goggles and mask. Increase LFTs: Noted on chart review, rechecking today. RTC 1 year

## 2020-03-22 IMAGING — DX DG CHEST 2V
2 series · 2 of 2 positions shown · non-contrast
Comparison: None.

CLINICAL DATA: Cough.  2GLY2-U3 positive

EXAM:
CHEST - 2 VIEW

[chest pa]
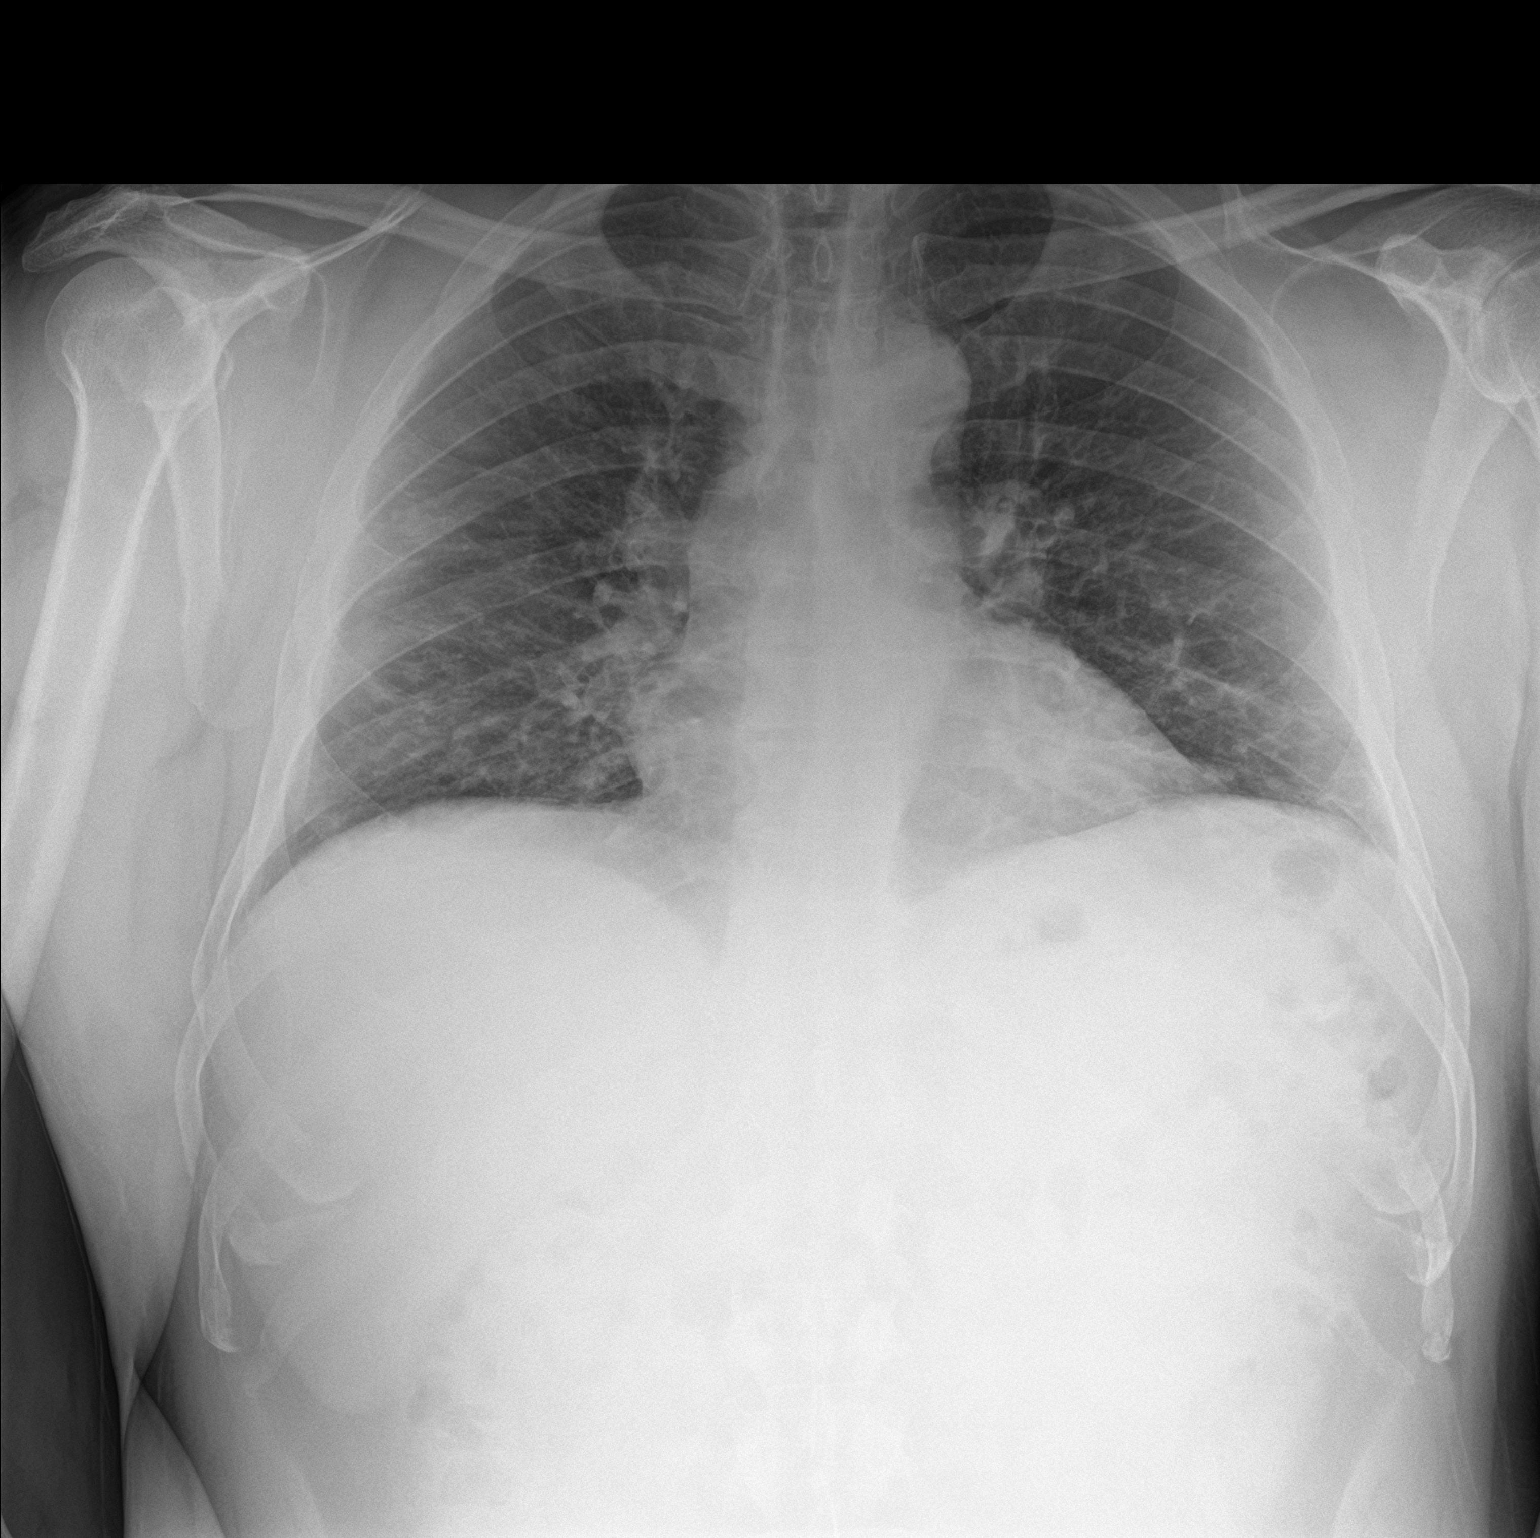

[chest lat]
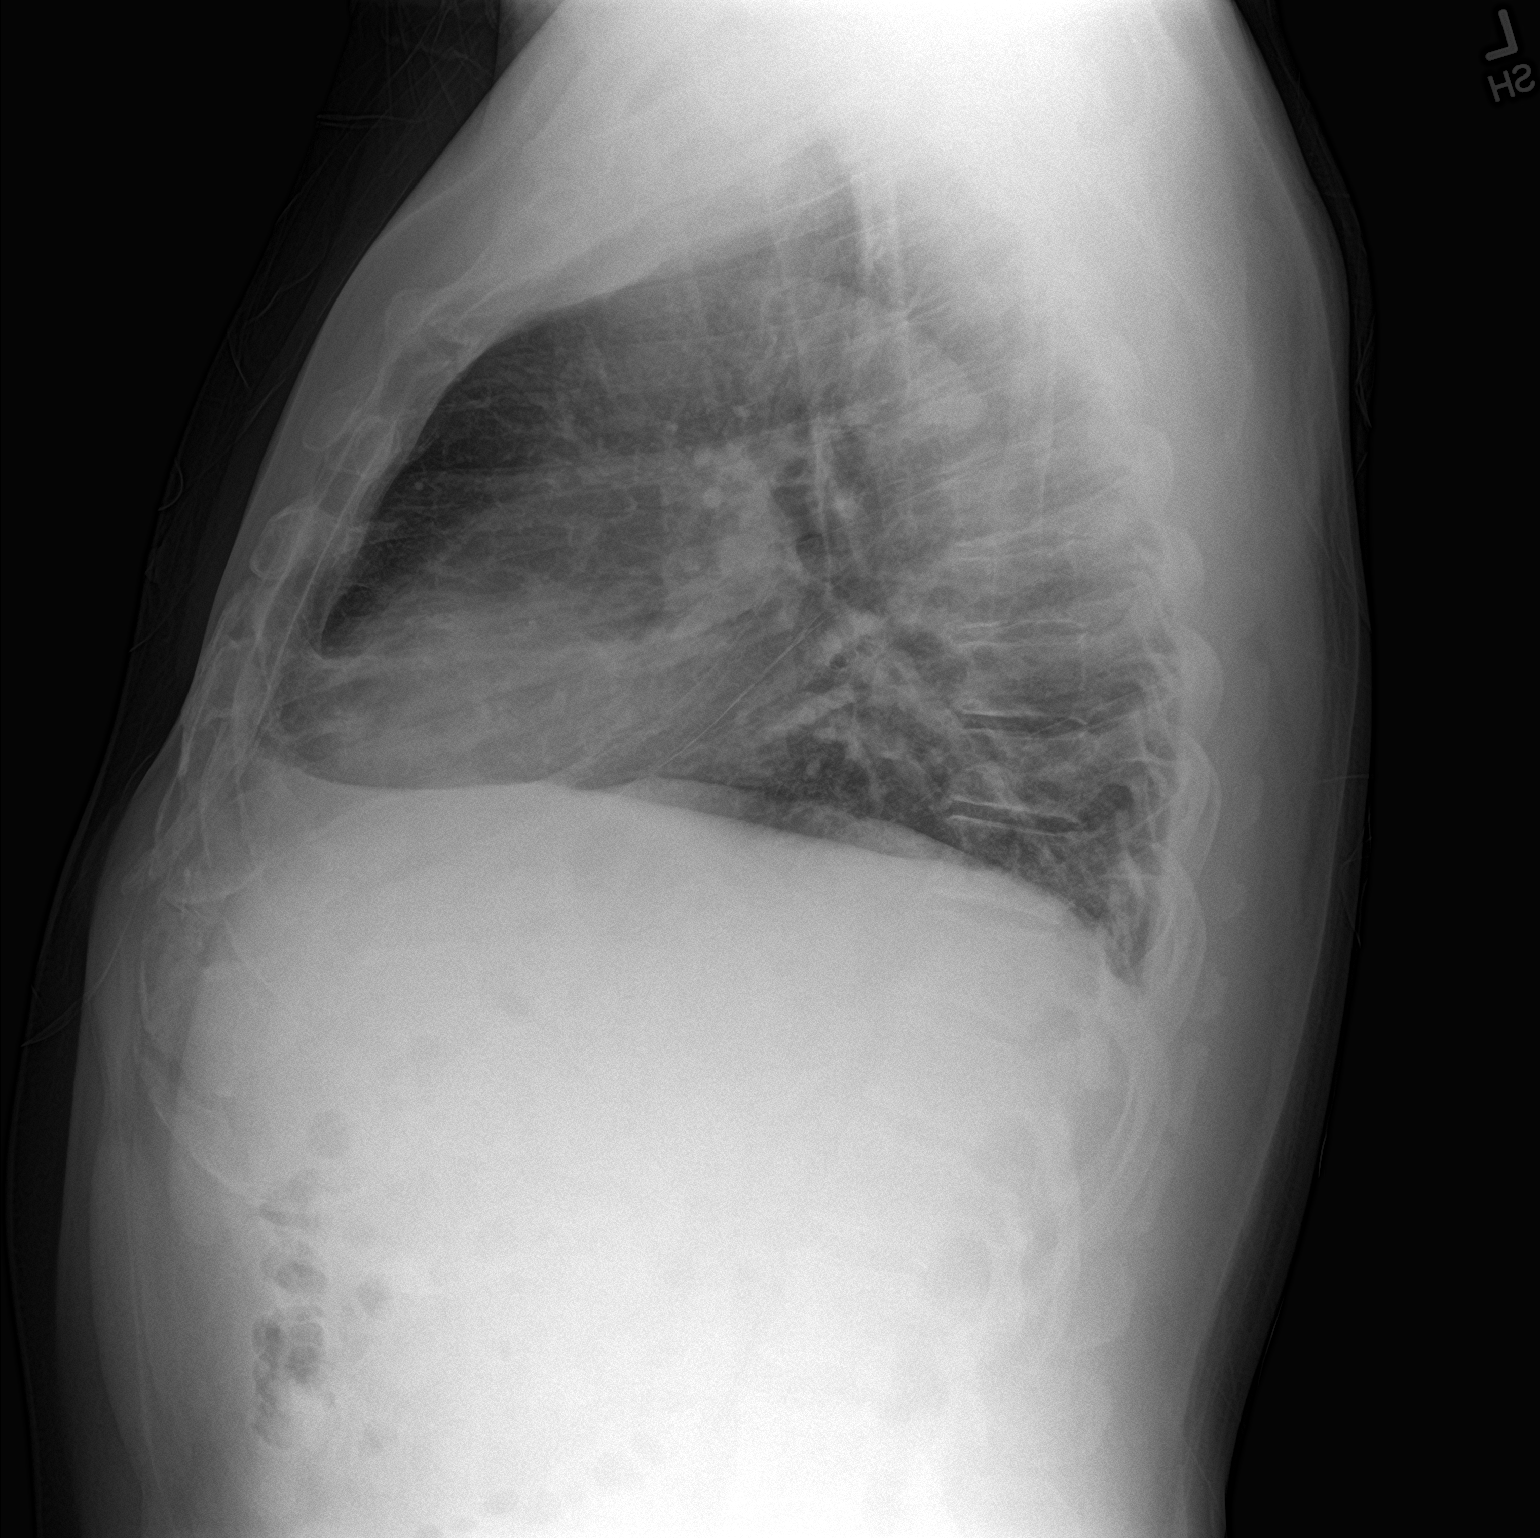

[2 of 2 positions shown; findings below may reference images not displayed]

FINDINGS: Hypoventilation with decreased lung volume and mild bibasilar
atelectasis. Negative for pneumonia or effusion. Negative for heart
failure.
IMPRESSION: Hypoventilation with mild bibasilar atelectasis.

## 2020-08-21 ENCOUNTER — Encounter: Payer: Self-pay | Admitting: Internal Medicine

## 2020-08-21 ENCOUNTER — Ambulatory Visit: Payer: 59 | Admitting: Internal Medicine

## 2020-08-21 ENCOUNTER — Other Ambulatory Visit: Payer: Self-pay

## 2020-08-21 VITALS — BP 118/79 | HR 73 | Temp 97.5°F | Resp 18 | Ht 66.0 in | Wt 206.4 lb

## 2020-08-21 DIAGNOSIS — K649 Unspecified hemorrhoids: Secondary | ICD-10-CM

## 2020-08-21 MED ORDER — HYDROCORTISONE ACETATE 25 MG RE SUPP: 25.0000 mg | Freq: Two times a day (BID) | RECTAL | 1 refills | Status: DC | PRN

## 2020-08-21 NOTE — Patient Instructions (Signed)
Eat fruits and vegetable every day  Metamucil 1 capsule daily with fluids  Use the suppositories if bleeding or pain  The gastroenterology department should contact you soon, if they don't let me know in a couple of weeks

## 2020-08-21 NOTE — Progress Notes (Signed)
Pre visit review using our clinic review tool, if applicable. No additional management support is needed unless otherwise documented below in the visit note. 

## 2020-08-21 NOTE — Progress Notes (Addendum)
   Subjective:    Patient ID: Zachary Kerr, male    DOB: [birth year], 44 y.o.   MRN: [id number]  DOS:  08/21/2020 Type of visit - description: Routine visit  Reports is having more problems with hemorrhoids. He used to have rectal bleeding once a month associated with BMs. For the last 2 months, bleeding has been more frequent, typically weekly, also associated with a palpable but not tender knot (? external hemorrhoid). Denies any itching. Denies any abdominal symptoms such as pain, nausea, vomiting. No fever or weight loss  Wt Readings from Last 3 Encounters:  08/21/20 206 lb 6 oz (93.6 kg)  01/18/20 199 lb 2 oz (90.3 kg)  06/13/19 209 lb 3.2 oz (94.9 kg)     Review of Systems See above   Past Medical History:  Diagnosis Date  . Environmental allergies   . GERD (gastroesophageal reflux disease)     Past Surgical History:  Procedure Laterality Date  . ROTATOR CUFF REPAIR Left 2010    Allergies as of 08/21/2020   No Known Allergies     Medication List    as of August 21, 2020  3:35 PM   You have not been prescribed any medications.        Objective:   Physical Exam BP 118/79 (BP Location: Right Arm, Patient Position: Sitting, Cuff Size: Normal)   Pulse 73   Temp (!) 97.5 F (36.4 C) (Oral)   Resp 18   Ht 5\' 6"  (1.676 m)   Wt 206 lb 6 oz (93.6 kg)   SpO2 97%   BMI 33.31 kg/m  General:   Well developed, NAD, BMI noted.  HEENT:  Normocephalic . Face symmetric, atraumatic    Abdomen:  Not distended, soft, non-tender. No rebound or rigidity. DRE: Prostate symmetric, not tender or nodular No mass No stool sounds External examination with no masses or active hemorrhoids Anoscopy: + Internal hemorrhoids without evidence of bleeding. Skin: Not pale. Not jaundice Lower extremities: no pretibial edema bilaterally  Neurologic:  alert & oriented X3.  Speech normal, gait appropriate for age and unassisted Psych--  Cognition and judgment appear intact.    Cooperative with normal attention span and concentration.  Behavior appropriate. No anxious or depressed appearing.     Assessment     Assessment GERD OSA DX 2020 Internal hemorrhoids  Plan: Internal hemorrhoids (and external ones?): His symptoms are increasing, he is having more prolonged and frequent bleeding. From time to time he is able to palpate a hemorrhoid so possibly it is a external hemorrhoid or a large internal/prolapsed  hemorrhoid. Anoscopy: After obtaining verbal consent from the patient, I use a disposable self lighted plastic anoscope.  The patient tolerated the procedure well and no immediate complications. Findings:internal  hemorrhoids. Plan: Refer to GI for further evaluation.  High fiber diet, Anusol HC as needed. Preventive care: Declined it flu shot, had a Covid vaccine J&J 07/23/2020, recommend booster in 2 months.     This visit occurred during the SARS-CoV-2 public health emergency.  Safety protocols were in place, including screening questions prior to the visit, additional usage of staff PPE, and extensive cleaning of exam room while observing appropriate contact time as indicated for disinfecting solutions.

## 2020-08-24 NOTE — Assessment & Plan Note (Addendum)
Internal hemorrhoids (and external ones?): His symptoms are increasing, he is having more prolonged and frequent bleeding. From time to time he is able to palpate a hemorrhoid so possibly it is a external hemorrhoid or a large internal/prolapsed  hemorrhoid. Anoscopy: After obtaining verbal consent from the patient, I use a disposable self lighted plastic anoscope.  The patient tolerated the procedure well and no immediate complications. Findings:internal  hemorrhoids. Plan: Refer to GI for further evaluation.  High fiber diet, Anusol HC as needed. Preventive care: Declined it flu shot, had a Covid vaccine J&J 07/23/2020, recommend booster in 2 months.

## 2020-10-16 ENCOUNTER — Telehealth: Payer: Self-pay

## 2020-10-16 NOTE — Telephone Encounter (Signed)
Spoke with patient and tried to make him a virtual appointment and he said he was instructed by the nurse he spoke with to go to [facility] to get a Covid test.  I asked patient if he wanted a virtual appointment with his PCP and he declined based on what the nurse instructed him to do.  I advised the patient should he develop any coughing, etc that he cannot control with OTC meds, to please call us back for a virtual visit with his PCP.

## 2020-10-16 NOTE — Telephone Encounter (Signed)
Needs virtual visit please.  

## 2020-10-16 NOTE — Telephone Encounter (Signed)
Nurse Assessment Nurse: Isabell Jarvis, RN, [name] Date/Time Zachary Kerr Time): 10/16/2020 9:06:25 AM Confirm and document reason for call. If symptomatic, describe symptoms. ---Caller states he has a sore throat, headache, cough, sinus congestion and nausea. Symptoms started yesterday. No SOB. Has not taken his temp. He is vaccinated for COVID. He may have been exposed to COVID at work. Headache is a 4 out of 10 pain scale. Does the patient have any new or worsening symptoms? ---Yes Will a triage be completed? ---Yes Related visit to physician within the last 2 weeks? ---No Does the PT have any chronic conditions? (i.e. diabetes, asthma, this includes High risk factors for pregnancy, etc.) ---No Is this a behavioral health or substance abuse call? ---No Guidelines Guideline Title Affirmed Question Affirmed Notes Nurse Date/Time Zachary Kerr Time) Sinus Pain or Congestion Lots of coughing Isabell Jarvis, RN, [name] 10/16/2020 9:11:07 AM Disp. Time Zachary Kerr Time) Disposition Final User 10/16/2020 9:15:11 AM SEE PCP WITHIN 3 DAYS Yes Isabell Jarvis, RN, [name] Disagree/Comply Comply Caller Understands Yes PreDisposition Call Doctor PLEASE NOTE: All timestamps contained within this report are represented as [place] Standard Time. CONFIDENTIALTY NOTICE: This fax transmission is intended only for the addressee. It contains information that is legally privileged, confidential or otherwise protected from use or disclosure. If you are not the intended recipient, you are strictly prohibited from reviewing, disclosing, copying using or disseminating any of this information or taking any action in reliance on or regarding this information. If you have received this fax in error, please notify us immediately by telephone so that we can arrange for its return to [place]. Phone: [phone], Toll-Free: [phone], Fax: [phone] Page: 2 of 2 Call Id: [id number] Care Advice Given Per Guideline CALL BACK IF: * Difficulty  breathing (and not relieved by cleaning out nose) * You become worse CARE ADVICE given per Sinus Pain or Congestion (Adult) guideline

## 2020-10-16 NOTE — Telephone Encounter (Signed)
Noted, thanks!

## 2020-12-05 ENCOUNTER — Encounter: Payer: Self-pay | Admitting: Internal Medicine

## 2020-12-27 HISTORY — PX: SHOULDER ARTHROSCOPY: SHX128

## 2021-01-09 ENCOUNTER — Encounter (HOSPITAL_BASED_OUTPATIENT_CLINIC_OR_DEPARTMENT_OTHER): Payer: Self-pay | Admitting: Orthopedic Surgery

## 2021-01-09 ENCOUNTER — Other Ambulatory Visit: Payer: Self-pay

## 2021-01-09 NOTE — Progress Notes (Signed)
Spoke w/ via phone for pre-op interview--- PT Lab needs dos---- no              Lab results------ no COVID test ------  01-16-2021 @ 1200 Arrive at -------  1045 on 01-17-2021 NPO after MN NO Solid Food.  Clear liquids from MN until--- 0945 Med rec completed Medications to take morning of surgery ----- NONE Diabetic medication ----- n/a Patient instructed to bring photo id and insurance card day of surgery Patient aware to have Driver (ride ) / caregiver  for 24 hours after surgery -- wife, Insurance account manager Patient Special Instructions ----- n/a Pre-Op special Istructions ----- n/a Patient verbalized understanding of instructions that were given at this phone interview. Patient denies shortness of breath, chest pain, fever, cough at this phone interview.

## 2021-01-16 ENCOUNTER — Other Ambulatory Visit (HOSPITAL_COMMUNITY)
Admission: RE | Admit: 2021-01-16 | Discharge: 2021-01-16 | Disposition: A | Discharge status: Home / Self Care | Payer: 59 | Source: Ambulatory Visit | Attending: Orthopedic Surgery | Admitting: Orthopedic Surgery

## 2021-01-16 DIAGNOSIS — Z01812 Encounter for preprocedural laboratory examination: Secondary | ICD-10-CM | POA: Diagnosis not present

## 2021-01-16 DIAGNOSIS — Z20822 Contact with and (suspected) exposure to covid-19: Secondary | ICD-10-CM | POA: Insufficient documentation

## 2021-01-16 LAB — SARS CORONAVIRUS 2 (TAT 6-24 HRS): SARS Coronavirus 2: NEGATIVE

## 2021-01-17 ENCOUNTER — Ambulatory Visit (HOSPITAL_BASED_OUTPATIENT_CLINIC_OR_DEPARTMENT_OTHER)
Admission: RE | Admit: 2021-01-17 | Discharge: 2021-01-17 | Disposition: A | Discharge status: Home / Self Care | Payer: 59 | Attending: Orthopedic Surgery | Admitting: Orthopedic Surgery

## 2021-01-17 ENCOUNTER — Encounter (HOSPITAL_BASED_OUTPATIENT_CLINIC_OR_DEPARTMENT_OTHER)
Admission: RE | Disposition: A | Discharge status: Home / Self Care | Payer: Self-pay | Source: Home / Self Care | Attending: Orthopedic Surgery

## 2021-01-17 ENCOUNTER — Ambulatory Visit (HOSPITAL_BASED_OUTPATIENT_CLINIC_OR_DEPARTMENT_OTHER): Payer: 59 | Admitting: Anesthesiology

## 2021-01-17 ENCOUNTER — Encounter (HOSPITAL_BASED_OUTPATIENT_CLINIC_OR_DEPARTMENT_OTHER): Payer: Self-pay | Admitting: Orthopedic Surgery

## 2021-01-17 ENCOUNTER — Other Ambulatory Visit: Payer: Self-pay

## 2021-01-17 DIAGNOSIS — Z842 Family history of other diseases of the genitourinary system: Secondary | ICD-10-CM | POA: Diagnosis not present

## 2021-01-17 DIAGNOSIS — M75112 Incomplete rotator cuff tear or rupture of left shoulder, not specified as traumatic: Secondary | ICD-10-CM | POA: Diagnosis not present

## 2021-01-17 DIAGNOSIS — M7542 Impingement syndrome of left shoulder: Secondary | ICD-10-CM | POA: Insufficient documentation

## 2021-01-17 DIAGNOSIS — Z79899 Other long term (current) drug therapy: Secondary | ICD-10-CM | POA: Insufficient documentation

## 2021-01-17 DIAGNOSIS — Z8616 Personal history of COVID-19: Secondary | ICD-10-CM | POA: Diagnosis not present

## 2021-01-17 DIAGNOSIS — M7582 Other shoulder lesions, left shoulder: Secondary | ICD-10-CM | POA: Diagnosis not present

## 2021-01-17 DIAGNOSIS — M19012 Primary osteoarthritis, left shoulder: Secondary | ICD-10-CM | POA: Diagnosis present

## 2021-01-17 DIAGNOSIS — K219 Gastro-esophageal reflux disease without esophagitis: Secondary | ICD-10-CM | POA: Diagnosis not present

## 2021-01-17 HISTORY — DX: Unspecified hemorrhoids: K64.9

## 2021-01-17 HISTORY — DX: Dependence on other enabling machines and devices: Z99.89

## 2021-01-17 HISTORY — DX: Pain in left shoulder: M25.512

## 2021-01-17 HISTORY — DX: Personal history of COVID-19: Z86.16

## 2021-01-17 HISTORY — DX: Obstructive sleep apnea (adult) (pediatric): G47.33

## 2021-01-17 HISTORY — DX: Primary osteoarthritis, left shoulder: M19.012

## 2021-01-17 SURGERY — SHOULDER ARTHROSCOPY WITH SUBACROMIAL DECOMPRESSION AND DISTAL CLAVICLE EXCISION
Anesthesia: General | Site: Shoulder | Laterality: Left

## 2021-01-17 MED ORDER — BUPIVACAINE-EPINEPHRINE (PF) 0.5% -1:200000 IJ SOLN
INTRAMUSCULAR | Status: DC | PRN
  Administered 2021-01-17: 15 mL via PERINEURAL

## 2021-01-17 MED ORDER — SUGAMMADEX SODIUM 200 MG/2ML IV SOLN
INTRAVENOUS | Status: DC | PRN
  Administered 2021-01-17: 200 mg via INTRAVENOUS

## 2021-01-17 MED ORDER — OXYCODONE HCL 5 MG/5ML PO SOLN: 5.0000 mg | Freq: Once | ORAL | Status: DC | PRN

## 2021-01-17 MED ORDER — OXYCODONE HCL 5 MG PO TABS: 5.0000 mg | ORAL_TABLET | Freq: Three times a day (TID) | ORAL | 0 refills | Status: DC | PRN

## 2021-01-17 MED ORDER — ONDANSETRON HCL 4 MG/2ML IJ SOLN
INTRAMUSCULAR | Status: DC | PRN
  Administered 2021-01-17: 4 mg via INTRAVENOUS

## 2021-01-17 MED ORDER — PROPOFOL 10 MG/ML IV BOLUS
INTRAVENOUS | Status: DC | PRN
  Administered 2021-01-17: 180 mg via INTRAVENOUS

## 2021-01-17 MED ORDER — LIDOCAINE 2% (20 MG/ML) 5 ML SYRINGE
INTRAMUSCULAR | Status: DC | PRN
  Administered 2021-01-17: 60 mg via INTRAVENOUS

## 2021-01-17 MED ORDER — BUPIVACAINE LIPOSOME 1.3 % IJ SUSP
INTRAMUSCULAR | Status: DC | PRN
  Administered 2021-01-17: 10 mL via PERINEURAL

## 2021-01-17 MED ORDER — PROPOFOL 10 MG/ML IV BOLUS
INTRAVENOUS | Status: AC
  Filled 2021-01-17: qty 20

## 2021-01-17 MED ORDER — MIDAZOLAM HCL 2 MG/2ML IJ SOLN
2.0000 mg | Freq: Once | INTRAMUSCULAR | Status: AC
  Administered 2021-01-17: 1 mg via INTRAVENOUS

## 2021-01-17 MED ORDER — SODIUM CHLORIDE 0.9 % IR SOLN
Status: DC | PRN
  Administered 2021-01-17: 200 mL

## 2021-01-17 MED ORDER — FENTANYL CITRATE (PF) 100 MCG/2ML IJ SOLN
100.0000 ug | Freq: Once | INTRAMUSCULAR | Status: AC
  Administered 2021-01-17: 50 ug via INTRAVENOUS

## 2021-01-17 MED ORDER — OXYCODONE HCL 5 MG PO TABS: 5.0000 mg | ORAL_TABLET | Freq: Once | ORAL | Status: DC | PRN

## 2021-01-17 MED ORDER — EPHEDRINE 5 MG/ML INJ
INTRAVENOUS | Status: AC
  Filled 2021-01-17: qty 10

## 2021-01-17 MED ORDER — ACETAMINOPHEN 500 MG PO TABS
1000.0000 mg | ORAL_TABLET | Freq: Once | ORAL | Status: AC
  Administered 2021-01-17: 1000 mg via ORAL

## 2021-01-17 MED ORDER — LACTATED RINGERS IV SOLN: INTRAVENOUS | Status: DC

## 2021-01-17 MED ORDER — ONDANSETRON 4 MG PO TBDP: 4.0000 mg | ORAL_TABLET | Freq: Three times a day (TID) | ORAL | 0 refills | Status: DC | PRN

## 2021-01-17 MED ORDER — DEXAMETHASONE SODIUM PHOSPHATE 10 MG/ML IJ SOLN
INTRAMUSCULAR | Status: AC
  Filled 2021-01-17: qty 1

## 2021-01-17 MED ORDER — HYDRALAZINE HCL 20 MG/ML IJ SOLN
INTRAMUSCULAR | Status: AC
  Filled 2021-01-17: qty 1

## 2021-01-17 MED ORDER — FENTANYL CITRATE (PF) 100 MCG/2ML IJ SOLN
INTRAMUSCULAR | Status: AC
  Filled 2021-01-17: qty 2

## 2021-01-17 MED ORDER — ROCURONIUM BROMIDE 10 MG/ML (PF) SYRINGE
PREFILLED_SYRINGE | INTRAVENOUS | Status: AC
  Filled 2021-01-17: qty 10

## 2021-01-17 MED ORDER — LIDOCAINE 2% (20 MG/ML) 5 ML SYRINGE
INTRAMUSCULAR | Status: AC
  Filled 2021-01-17: qty 5

## 2021-01-17 MED ORDER — CEFAZOLIN SODIUM-DEXTROSE 2-4 GM/100ML-% IV SOLN
INTRAVENOUS | Status: AC
  Filled 2021-01-17: qty 100

## 2021-01-17 MED ORDER — ACETAMINOPHEN 500 MG PO TABS
ORAL_TABLET | ORAL | Status: AC
  Filled 2021-01-17: qty 2

## 2021-01-17 MED ORDER — DEXAMETHASONE SODIUM PHOSPHATE 10 MG/ML IJ SOLN
INTRAMUSCULAR | Status: DC | PRN
  Administered 2021-01-17: 10 mg via INTRAVENOUS

## 2021-01-17 MED ORDER — CEFAZOLIN SODIUM-DEXTROSE 2-4 GM/100ML-% IV SOLN
2.0000 g | INTRAVENOUS | Status: AC
  Administered 2021-01-17: 2 g via INTRAVENOUS

## 2021-01-17 MED ORDER — FENTANYL CITRATE (PF) 100 MCG/2ML IJ SOLN: 25.0000 ug | INTRAMUSCULAR | Status: DC | PRN

## 2021-01-17 MED ORDER — PROMETHAZINE HCL 25 MG/ML IJ SOLN: 6.2500 mg | INTRAMUSCULAR | Status: DC | PRN

## 2021-01-17 MED ORDER — ONDANSETRON HCL 4 MG/2ML IJ SOLN
INTRAMUSCULAR | Status: AC
  Filled 2021-01-17: qty 2

## 2021-01-17 MED ORDER — MIDAZOLAM HCL 2 MG/2ML IJ SOLN
INTRAMUSCULAR | Status: AC
  Filled 2021-01-17: qty 2

## 2021-01-17 MED ORDER — ROCURONIUM BROMIDE 10 MG/ML (PF) SYRINGE
PREFILLED_SYRINGE | INTRAVENOUS | Status: DC | PRN
  Administered 2021-01-17: 60 mg via INTRAVENOUS

## 2021-01-17 MED ORDER — SODIUM CHLORIDE 0.9 % IR SOLN
Status: DC | PRN
  Administered 2021-01-17: 1 mL

## 2021-01-17 SURGICAL SUPPLY — 75 items
BLADE SURG 11 STRL SS (BLADE) ×2 IMPLANT
BLADE SURG 15 STRL LF DISP TIS (BLADE) IMPLANT
BLADE SURG 15 STRL SS (BLADE)
BURR OVAL 8 FLU 4.0X13 (MISCELLANEOUS) ×2 IMPLANT
CANNULA 5.75X7 CRYSTAL CLEAR (CANNULA) ×2 IMPLANT
CANNULA 5.75X71 LONG (CANNULA) IMPLANT
CANNULA TWIST IN 8.25X7CM (CANNULA) IMPLANT
CONNECTOR 5 IN 1 STRAIGHT STRL (MISCELLANEOUS) ×2 IMPLANT
COVER WAND RF STERILE (DRAPES) ×2 IMPLANT
DISSECTOR  3.8MM X 13CM (MISCELLANEOUS) ×2
DISSECTOR 3.8MM X 13CM (MISCELLANEOUS) ×1 IMPLANT
DRAPE ORTHO SPLIT 77X108 STRL (DRAPES) ×4
DRAPE POUCH INSTRU U-SHP 10X18 (DRAPES) ×2 IMPLANT
DRAPE SHEET LG 3/4 BI-LAMINATE (DRAPES) ×2 IMPLANT
DRAPE STERI 35X30 U-POUCH (DRAPES) ×2 IMPLANT
DRAPE SURG 17X23 STRL (DRAPES) ×2 IMPLANT
DRAPE SURG ORHT 6 SPLT 77X108 (DRAPES) ×2 IMPLANT
DRAPE U-SHAPE 47X51 STRL (DRAPES) ×2 IMPLANT
DRSG EMULSION OIL 3X3 NADH (GAUZE/BANDAGES/DRESSINGS) ×2 IMPLANT
DRSG PAD ABDOMINAL 8X10 ST (GAUZE/BANDAGES/DRESSINGS) ×2 IMPLANT
DURAPREP 26ML APPLICATOR (WOUND CARE) IMPLANT
ELECT REM PT RETURN 9FT ADLT (ELECTROSURGICAL)
ELECTRODE REM PT RTRN 9FT ADLT (ELECTROSURGICAL) IMPLANT
FIBERSTICK 2 (SUTURE) IMPLANT
GAUZE SPONGE 4X4 12PLY STRL (GAUZE/BANDAGES/DRESSINGS) ×2 IMPLANT
GAUZE SPONGE 4X4 12PLY STRL LF (GAUZE/BANDAGES/DRESSINGS) ×2 IMPLANT
GLOVE SURG ENC MOIS LTX SZ7.5 (GLOVE) ×4 IMPLANT
GLOVE SURG UNDER LTX SZ8 (GLOVE) ×4 IMPLANT
GOWN STRL REUS W/TWL LRG LVL3 (GOWN DISPOSABLE) ×4 IMPLANT
IV NS IRRIG 3000ML ARTHROMATIC (IV SOLUTION) ×4 IMPLANT
KIT TURNOVER CYSTO (KITS) ×2 IMPLANT
LASSO SUT 90 DEGREE (SUTURE) IMPLANT
LOOP 2 FIBERLINK CLOSED (SUTURE) IMPLANT
MANIFOLD NEPTUNE II (INSTRUMENTS) ×2 IMPLANT
NDL SAFETY ECLIPSE 18X1.5 (NEEDLE) IMPLANT
NEEDLE FILTER BLUNT 18X 1/2SAF (NEEDLE) ×1
NEEDLE FILTER BLUNT 18X1 1/2 (NEEDLE) ×1 IMPLANT
NEEDLE HYPO 18GX1.5 SHARP (NEEDLE)
NEEDLE MAYO 6 CRC TAPER PT (NEEDLE) IMPLANT
NEEDLE MAYO CATGUT SZ4 (NEEDLE) IMPLANT
NEEDLE SCORPION MULTI FIRE (NEEDLE) IMPLANT
NS IRRIG 500ML POUR BTL (IV SOLUTION) ×2 IMPLANT
PACK ARTHROSCOPY DSU (CUSTOM PROCEDURE TRAY) ×2 IMPLANT
PACK BASIN DAY SURGERY FS (CUSTOM PROCEDURE TRAY) ×2 IMPLANT
PAD ARMBOARD 7.5X6 YLW CONV (MISCELLANEOUS) IMPLANT
PENCIL SMOKE EVACUATOR (MISCELLANEOUS) IMPLANT
PROBE APOLLO 90XL (SURGICAL WAND) ×2 IMPLANT
SLEEVE ARM SUSPENSION SYSTEM (MISCELLANEOUS) ×2 IMPLANT
SLING ARM IMMOBILIZER LRG (SOFTGOODS) ×2 IMPLANT
SLING S3 LATERAL DISP (MISCELLANEOUS) ×2 IMPLANT
SLING ULTRA II AB L (ORTHOPEDIC SUPPLIES) IMPLANT
SPONGE LAP 4X18 RFD (DISPOSABLE) IMPLANT
STRIP CLOSURE SKIN 1/2X4 (GAUZE/BANDAGES/DRESSINGS) ×2 IMPLANT
SUCTION FRAZIER HANDLE 10FR (MISCELLANEOUS)
SUCTION TUBE FRAZIER 10FR DISP (MISCELLANEOUS) IMPLANT
SUT 2 FIBERLOOP 20 STRT BLUE (SUTURE)
SUT ETHILON 3 0 PS 1 (SUTURE) IMPLANT
SUT FIBERWIRE #2 38 T-5 BLUE (SUTURE)
SUT LASSO 45 DEGREE LEFT (SUTURE) IMPLANT
SUT LASSO 45D RIGHT (SUTURE) IMPLANT
SUT MNCRL AB 3-0 PS2 27 (SUTURE) ×2 IMPLANT
SUT PDS AB 0 CT1 36 (SUTURE) IMPLANT
SUT TIGER TAPE 7 IN WHITE (SUTURE) IMPLANT
SUT VIC AB 0 CT1 36 (SUTURE) IMPLANT
SUT VIC AB 2-0 CT1 27 (SUTURE)
SUT VIC AB 2-0 CT1 TAPERPNT 27 (SUTURE) IMPLANT
SUTURE 2 FIBERLOOP 20 STRT BLU (SUTURE) IMPLANT
SUTURE FIBERWR #2 38 T-5 BLUE (SUTURE) IMPLANT
SYR CONTROL 10ML LL (SYRINGE) IMPLANT
SYR TB 1ML LL NO SAFETY (SYRINGE) IMPLANT
TAPE CLOTH SURG 6X10 WHT LF (GAUZE/BANDAGES/DRESSINGS) ×2 IMPLANT
TOWEL OR 17X26 10 PK STRL BLUE (TOWEL DISPOSABLE) ×2 IMPLANT
TUBE CONNECTING 12X1/4 (SUCTIONS) ×4 IMPLANT
TUBING ARTHROSCOPY IRRIG 16FT (MISCELLANEOUS) ×2 IMPLANT
YANKAUER SUCT BULB TIP NO VENT (SUCTIONS) IMPLANT

## 2021-01-17 NOTE — Progress Notes (Signed)
Reviewed pre op instrctions with pt by phone

## 2021-01-17 NOTE — Anesthesia Procedure Notes (Signed)
Anesthesia Regional Block: Interscalene brachial plexus block   Pre-Anesthetic Checklist: ,, timeout performed, Correct Patient, Correct Site, Correct Laterality, Correct Procedure, Correct Position, site marked, Risks and benefits discussed, pre-op evaluation,  At surgeon's request and post-op pain management  Laterality: Left  Prep: Maximum Sterile Barrier Precautions used, chloraprep       Needles:  Injection technique: Single-shot  Needle Type: Echogenic Stimulator Needle     Needle Length: 4cm  Needle Gauge: 22     Additional Needles:   Procedures:,,,, ultrasound used (permanent image in chart),,,,  Narrative:  Start time: 01/17/2021 12:30 PM End time: 01/17/2021 12:32 PM Injection made incrementally with aspirations every 5 mL.  Performed by: Personally  Anesthesiologist: Kaylyn Layer, MD  Additional Notes: Risks, benefits, and alternative discussed. Patient gave consent for procedure. Patient prepped and draped in sterile fashion. Sedation administered, patient remains easily responsive to voice. Relevant anatomy identified with ultrasound guidance. Local anesthetic given in 5cc increments with no signs or symptoms of intravascular injection. No pain or paraesthesias with injection. Patient monitored throughout procedure with signs of LAST or immediate complications. Tolerated well. Ultrasound image placed in chart.  Amalia Greenhouse, MD

## 2021-01-17 NOTE — H&P (Signed)
ORTHOPAEDIC H&P  REQUESTING PHYSICIAN: Yolonda Kida, MD  PCP:  Wanda Plump, MD  Chief Complaint: Left shoulder pain  HPI: Zachary Kerr is a 45 y.o. male who complains of left shoulder pain.  He has recalcitrant pain related to impingement and distal clavicle arthritis.  He is here today for arthroscopy.  No new complaints.  Past Medical History:  Diagnosis Date  . Arthritis of left acromioclavicular joint   . Environmental allergies   . GERD (gastroesophageal reflux disease)   . Hemorrhoids   . History of 2019 novel coronavirus disease (COVID-19) 09/2019  and 01/ 2022   per pt both times mild symptoms that resolved,  pt does not have results for 01/ 2022  . Left shoulder pain   . OSA on CPAP    followed by dr Frances Furbish--- studay in epic 03-06-2019, moderate osa,, pt stated uses 5 days per week   Past Surgical History:  Procedure Laterality Date  . ROTATOR CUFF REPAIR Right 2010   Social History   Socioeconomic History  . Marital status: Married    Spouse name: Not on file  . Number of children: 3  . Years of education: Not on file  . Highest education level: Not on file  Occupational History  . Occupation: Estate agent   Tobacco Use  . Smoking status: Never Smoker  . Smokeless tobacco: Never Used  Vaping Use  . Vaping Use: Never used  Substance and Sexual Activity  . Alcohol use: Not Currently    Comment: socially   . Drug use: Never  . Sexual activity: Not on file  Other Topics Concern  . Not on file  Social History Narrative   [place]    Household: pt, wife, 3 children    Social Determinants of Health   Financial Resource Strain: Not on file  Food Insecurity: Not on file  Transportation Needs: Not on file  Physical Activity: Not on file  Stress: Not on file  Social Connections: Not on file   Family History  Problem Relation Age of Onset  . Alcohol abuse Father        GF  . Benign prostatic hyperplasia Other        auncle   .  Diabetes Neg Hx   . Hypertension Neg Hx   . Heart attack Neg Hx   . Colon cancer Neg Hx   . Prostate cancer Neg Hx    Allergies  Allergen Reactions  . Other     Belt buckles, watch band and necklaces break with a rash.   Prior to Admission medications   Medication Sig Start Date End Date Taking? Authorizing Provider  omeprazole (PRILOSEC) 20 MG capsule Take 20 mg by mouth as needed.   Yes [provider]  hydrocortisone (ANUSOL-HC) 25 MG suppository Place 1 suppository (25 mg total) rectally 2 (two) times daily as needed for hemorrhoids. 08/21/20   Wanda Plump, MD   No results found.  Positive ROS: All other systems have been reviewed and were otherwise negative with the exception of those mentioned in the HPI and as above.  Physical Exam: General: Alert, no acute distress Cardiovascular: No pedal edema Respiratory: No cyanosis, no use of accessory musculature GI: No organomegaly, abdomen is soft and non-tender Skin: No lesions in the area of chief complaint Neurologic: Sensation intact distally Psychiatric: Patient is competent for consent with normal mood and affect Lymphatic: No axillary or cervical lymphadenopathy  MUSCULOSKELETAL: Left upper extremity  is warm and well-perfused with no open wounds or skin lesions.  Neurovascularly intact.  Assessment: 1.  Left shoulder subacromial impingement 2.  Left shoulder acromioclavicular joint arthritis 3.  Left shoulder degenerative labral tears. 4.  Left shoulder partial rotator cuff tear  Plan: -Plan is to proceed today with arthroscopic extensive debridement with subacromial decompression and distal clavicle resection.  We reviewed the risk and benefits of the procedure in detail.  All questions were solicited and answered to satisfaction.  We discussed the risk bleeding, infection, damage to surrounding nerves and vessels, stiffness, need for further surgery, propagation of tears, and the risk of anesthesia.  -Plan  for discharge home postoperatively from PACU.    Yolonda Kida, MD Cell [phone]    01/17/2021 12:24 PM

## 2021-01-17 NOTE — Transfer of Care (Signed)
Immediate Anesthesia Transfer of Care Note  Patient: Zachary Kerr  Procedure(s) Performed: SHOULDER ARTHROSCOPY WITH SUBACROMIAL DECOMPRESSION AND DISTAL CLAVICLE RESECTION, EXTENSIVE DEBRIDEMENT (Left Shoulder)  Patient Location: PACU  Anesthesia Type:GA combined with regional for post-op pain  Level of Consciousness: awake, alert  and oriented  Airway & Oxygen Therapy: Patient Spontanous Breathing and Patient connected to face mask oxygen  Post-op Assessment: Report given to RN and Post -op Vital signs reviewed and stable  Post vital signs: Reviewed and stable  Last Vitals:  Vitals Value Taken Time  BP 128/79   Temp    Pulse 67 01/17/21 1416  Resp 19 01/17/21 1416  SpO2 100 % 01/17/21 1416  Vitals shown include unvalidated device data.  Last Pain:  Vitals:   01/17/21 1133  PainSc: 2       Patients Stated Pain Goal: 5 (01/17/21 1133)  Complications: No complications documented.

## 2021-01-17 NOTE — Progress Notes (Signed)
Assisted Dr. Howze with left, ultrasound guided, interscalene  block. Side rails up, monitors on throughout procedure. See vital signs in flow sheet. Tolerated Procedure well. 

## 2021-01-17 NOTE — Op Note (Signed)
01/17/2021  1:52 PM  PATIENT:  Zachary Kerr  45 y.o. male  PRE-OPERATIVE DIAGNOSIS:   1. Left shoulder subacromial impingement 2. Left shoulder acromioclavicular joint arthritis 3. Left shoulder degenerative labral tears. 4. Left shoulder partial rotator cuff tear  POST-OPERATIVE DIAGNOSIS:   1. Left shoulder subacromial impingement 2. Left shoulder acromioclavicular joint arthritis 3. Left shoulder degenerative labral tears. 4. Left shoulder partial rotator cuff tear 5.  Left shoulder long head of biceps tendon tear  Surgeon: Duwayne Heck, MD  Assistant: Dion Saucier, PA-C  Assistant attestation:  PA Sharon Seller was present and utilized throughout the entire procedure.  He was necessary for positioning patient, execution of procedures listed, and closure of wounds and application of splint/brace.  ANESTHESIA:  general, with interscalene  IV FLUIDS AND URINE: See anesthesia.  ESTIMATED BLOOD LOSS: 5 mL.  IMPLANTS: None  DRAINS: None  COMPLICATIONS: None.  Indications:  [name] is a very nice right-hand-dominant 44 year old male here today for left shoulder surgery.  He has had recalcitrant symptoms consistent with his diagnosis of impingement, distal clavicle osteoarthritis as well as partial articular rotator cuff tears.  He has failed conservative treatment and is presented today for the above surgery.  We discussed the risk and benefits in details.  Questions were solicited and answered to his satisfaction and he has provided informed consent.  DESCRIPTION OF PROCEDURE: The patient was brought to the operating room and placed supine on the operating table.  The patient had been signed prior to the procedure and this was documented. The patient had the anesthesia placed by the anesthesiologist.  A time-out was performed to confirm that this was the correct patient, site, side and location. The patient did receive antibiotics prior to the incision and was re-dosed  during the procedure as needed at indicated intervals.  He was then positioned in the right lateral decubitus position with the left upper extremity up and exposed.   The patient had the operative extremity prepped and draped in the standard surgical fashion.     We began the procedure with diagnostic arthroscopy.  Standard posterior lateral viewing portal was established 2 cm inferior and 1 cm medial to the posterior lateral corner of the acromion.  Then, via spinal needle localization we established a mid glenoid working portal and the anterior rotator interval.  While working through this portal and viewing from the posterior portal we identified multiple intra-articular pathologies.  There was partial articular sided tearing of the upper border of the subscapularis but no detachment at the footprint.  There was thickening of the middle glenohumeral ligament as well as rotator interval tissue with erythema.  There was degenerative tearing of the superior, anterior, and posterior labrum.  There was a longitudinal split tearing noted in the intra-articular portion of the long head of the biceps.  The supraspinatus had 20% partial articular sided tearing.  The infraspinatus and teres minor were intact without tears.  There was no arthritis of the glenohumeral joint.  There were no loose bodies.  In the subacromial space we noted intact rotator cuff tendons.  No tears.  Abundant bursitis.  Type II acromion.  Sclerotic and edematous distal clavicle consistent with acromioclavicular joint arthritis.  We began the procedure with extensive debridement of the intra-articular space.  This included debriding the anterior labrum, superior labrum, and posterior labrum of degenerative tissue with motorized shaver.  We then performed a wide excision of the rotator interval with the motorized shaver and radiofrequency wand.  Next, we  did debride with motorized shaver to the upper border of the subscapularis tendon as well  as the inferior and articular surface of the supraspinatus partial tear.  Lastly, we did release the long head of the biceps tendon with an arthroscopic tenotomy.  Next, we moved to the subacromial space.  While in the subacromial space a wide bursectomy was performed with motorized shaver.  We established a working portal and the lateral position.  This was approximately 4 cm lateral to the lateral edge of the acromion in line with the midportion of the [facility] joint.  We then performed a subperiosteal dissection of the anterolateral border of the acromion.  This identified a type II acromion.  Then, while viewing from the lateral portal and working from the posterior portal we used a motorized bur.  Cutting block technique was used to transition the type II acromion to a nice type I flat acromion.  Lastly, we moved to perform the distal clavicle resection.  While viewing from the lateral portal and working from the anterior portal we used the radiofrequency wand to identify the distal clavicle.  There was significant osteophyte inferior and lateral.  We used a motorized bur to take 2 passes across the distal clavicle.  This resulted in a 1 cm resection of that joint.  Care was taken to preserve the posterior and superior capsular structures.  Pictures were taken throughout the procedure.  We were satisfied with the end result.  Arthroscopic instruments were removed.  Wounds were closed with buried 3-0 Monocryl.  The arm was cleaned and dried and placed in a simple sling.  Standard sterile dressings were applied.  Patient was awakened general anesthetic in stable condition.  He was transported to PACU.  No noted complications.  Counts were correct x2.  POSTOPERATIVE PLAN:  Pager will be in his sling on the operative extremity for 1 to 2 days.  He may remove this ad lib.  He may perform activities as tolerated as well.  He will begin outpatient physical therapy working on range of motion and strengthening in the  upcoming weeks.  He will come back to the office to see me for routine wound check in 2 weeks.

## 2021-01-17 NOTE — Anesthesia Postprocedure Evaluation (Signed)
Anesthesia Post Note  Patient: HASSEL UPHOFF  Procedure(s) Performed: SHOULDER ARTHROSCOPY WITH SUBACROMIAL DECOMPRESSION AND DISTAL CLAVICLE RESECTION, EXTENSIVE DEBRIDEMENT (Left Shoulder)     Patient location during evaluation: PACU Anesthesia Type: General Level of consciousness: awake and alert and oriented Pain management: pain level controlled Vital Signs Assessment: post-procedure vital signs reviewed and stable Respiratory status: spontaneous breathing, nonlabored ventilation and respiratory function stable Cardiovascular status: blood pressure returned to baseline Postop Assessment: no apparent nausea or vomiting Anesthetic complications: no   No complications documented.  Last Vitals:  Vitals:   01/17/21 1445 01/17/21 1500  BP: 119/85 115/90  Pulse: 63 72  Resp: 19 20  Temp:  (!) 36.3 C  SpO2: 95% 96%    Last Pain:  Vitals:   01/17/21 1500  PainSc: 0-No pain                 Kaylyn Layer

## 2021-01-17 NOTE — Anesthesia Preprocedure Evaluation (Addendum)
Anesthesia Evaluation  Patient identified by MRN, date of birth, ID band Patient awake    Reviewed: Allergy & Precautions, NPO status , Patient's Chart, lab work & pertinent test results  History of Anesthesia Complications Negative for: history of anesthetic complications  Airway Mallampati: II  TM Distance: >3 FB Neck ROM: Full    Dental no notable dental hx.    Pulmonary sleep apnea and Continuous Positive Airway Pressure Ventilation ,    Pulmonary exam normal        Cardiovascular negative cardio ROS Normal cardiovascular exam     Neuro/Psych negative neurological ROS  negative psych ROS   GI/Hepatic Neg liver ROS, GERD  Medicated and Controlled,  Endo/Other  negative endocrine ROS  Renal/GU negative Renal ROS  negative genitourinary   Musculoskeletal  (+) Arthritis , Left shoulder impingement, acromioclavicular osteoarthritis   Abdominal   Peds  Hematology negative hematology ROS (+)   Anesthesia Other Findings Day of surgery medications reviewed with patient.  Reproductive/Obstetrics negative OB ROS                            Anesthesia Physical Anesthesia Plan  ASA: II  Anesthesia Plan: General   Post-op Pain Management: GA combined w/ Regional for post-op pain   Induction: Intravenous  PONV Risk Score and Plan: 2 and Treatment may vary due to age or medical condition, Ondansetron, Dexamethasone and Midazolam  Airway Management Planned: Oral ETT  Additional Equipment: None  Intra-op Plan:   Post-operative Plan: Extubation in OR  Informed Consent: I have reviewed the patients History and Physical, chart, labs and discussed the procedure including the risks, benefits and alternatives for the proposed anesthesia with the patient or authorized representative who has indicated his/her understanding and acceptance.     Dental advisory given  Plan Discussed with:  CRNA  Anesthesia Plan Comments:        Anesthesia Quick Evaluation

## 2021-01-17 NOTE — Anesthesia Procedure Notes (Signed)
Procedure Name: Intubation Date/Time: 01/17/2021 1:06 PM Performed by: Kaylyn Layer, MD Pre-anesthesia Checklist: Patient identified, Emergency Drugs available, Suction available and Patient being monitored Patient Re-evaluated:Patient Re-evaluated prior to induction Oxygen Delivery Method: Circle System Utilized Preoxygenation: Pre-oxygenation with 100% oxygen Induction Type: IV induction Ventilation: Mask ventilation without difficulty and Oral airway inserted - appropriate to patient size Laryngoscope Size: Hyacinth Meeker and 2 Grade View: Grade III Tube type: Oral Tube size: 7.5 mm Number of attempts: 3 Airway Equipment and Method: Oral airway,  Video-laryngoscopy and Rigid stylet Placement Confirmation: ETT inserted through vocal cords under direct vision,  positive ETCO2 and breath sounds checked- equal and bilateral Secured at: 24 cm Tube secured with: Tape Dental Injury: Teeth and Oropharynx as per pre-operative assessment  Difficulty Due To: Difficult Airway- due to anterior larynx Future Recommendations: Recommend- induction with short-acting agent, and alternative techniques readily available Comments: First attempt by CRNA with inadequate view of glottis. Second attempt by myself with Hyacinth Meeker 2 blade, grade 3 view, anterior larynx noted. Mask ventilated and Glidescope #4 used to place ETT. Small amount of blood noted in posterior oropharynx-on further inspection appears to be minor trauma to base of tongue or right tonsil. No further bleeding noted when mouth suctioned several minutes later. SpO2 >95% throughout attempts. Stephannie Peters, MD

## 2021-01-17 NOTE — Discharge Instructions (Signed)
Orthopedic discharge instructions:  -Sling is to be worn for comfort only.  You may remove this as soon as your nerve block wears off, and begin using the arm as tolerated.  Certainly, you may use the sling if needed for pain relief.  -Maintain postoperative bandages for 3 days.  You may remove these on the third day and begin showering at that time.  He should pat the arm dry after showering and cover your incisions with Band-Aids as needed.  -Apply ice to the left shoulder for 20 to 30 minutes out of each hour that you are able around-the-clock.  -For mild to moderate pain use Tylenol and Advil in alternating fashion and around-the-clock.  For breakthrough pain use oxycodone as necessary.  -Return to see Dr. Aundria Rud in the office in 2 weeks.      Post Anesthesia Home Care Instructions  Activity: Get plenty of rest for the remainder of the day. A responsible individual must stay with you for 24 hours following the procedure.  For the next 24 hours, DO NOT: -Drive a car -Advertising copywriter -Drink alcoholic beverages -Take any medication unless instructed by your physician -Make any legal decisions or sign important papers.  Meals: Start with liquid foods such as gelatin or soup. Progress to regular foods as tolerated. Avoid greasy, spicy, heavy foods. If nausea and/or vomiting occur, drink only clear liquids until the nausea and/or vomiting subsides. Call your physician if vomiting continues.  Special Instructions/Symptoms: Your throat may feel dry or sore from the anesthesia or the breathing tube placed in your throat during surgery. If this causes discomfort, gargle with warm salt water. The discomfort should disappear within 24 hours.  If you had a scopolamine patch placed behind your ear for the management of post- operative nausea and/or vomiting:  1. The medication in the patch is effective for 72 hours, after which it should be removed.  Wrap patch in a tissue and discard in  the trash. Wash hands thoroughly with soap and water. 2. You may remove the patch earlier than 72 hours if you experience unpleasant side effects which may include dry mouth, dizziness or visual disturbances. 3. Avoid touching the patch. Wash your hands with soap and water after contact with the patch.     Information for Discharge Teaching: EXPAREL (bupivacaine liposome injectable suspension)   Your surgeon gave you EXPAREL(bupivacaine) in your surgical incision to help control your pain after surgery.   EXPAREL is a local anesthetic that provides pain relief by numbing the tissue around the surgical site.  EXPAREL is designed to release pain medication over time and can control pain for up to 72 hours.  Depending on how you respond to EXPAREL, you may require less pain medication during your recovery.  Possible side effects:  Temporary loss of sensation or ability to move in the area where bupivacaine was injected.  Nausea, vomiting, constipation  Rarely, numbness and tingling in your mouth or lips, lightheadedness, or anxiety may occur.  Call your doctor right away if you think you may be experiencing any of these sensations, or if you have other questions regarding possible side effects.  Follow all other discharge instructions given to you by your surgeon or nurse. Eat a healthy diet and drink plenty of water or other fluids.  If you return to the hospital for any reason within 96 hours following the administration of EXPAREL, please inform your health care providers.

## 2021-01-17 NOTE — Brief Op Note (Signed)
01/17/2021  1:52 PM  PATIENT:  Zachary Kerr  45 y.o. male  PRE-OPERATIVE DIAGNOSIS:   1.  Left shoulder subacromial impingement 2.  Left shoulder acromioclavicular joint arthritis 3.  Left shoulder degenerative labral tears. 4.  Left shoulder partial rotator cuff tear  POST-OPERATIVE DIAGNOSIS:   1.  Left shoulder subacromial impingement 2.  Left shoulder acromioclavicular joint arthritis 3.  Left shoulder degenerative labral tears. 4.  Left shoulder partial rotator cuff tear 5.  Left shoulder long head of biceps tendon tear  PROCEDURE:  Procedure(s): SHOULDER ARTHROSCOPY WITH SUBACROMIAL DECOMPRESSION AND DISTAL CLAVICLE RESECTION, EXTENSIVE DEBRIDEMENT (Left)  SURGEON:  Surgeon(s) and Role:    * Aundria Rud, Noah Delaine, MD - Primary  PHYSICIAN ASSISTANT:  Dion Saucier, PA-C  ANESTHESIA:   regional and general  EBL:  5 cc  BLOOD ADMINISTERED:none  DRAINS: none   LOCAL MEDICATIONS USED:  NONE  SPECIMEN:  No Specimen  DISPOSITION OF SPECIMEN:  N/A  COUNTS:  YES  TOURNIQUET:  * No tourniquets in log *  DICTATION: .Note written in EPIC  PLAN OF CARE: Discharge to home after PACU  PATIENT DISPOSITION:  PACU - hemodynamically stable.   Delay start of Pharmacological VTE agent (>24hrs) due to surgical blood loss or risk of bleeding: not applicable

## 2021-10-14 ENCOUNTER — Ambulatory Visit: Payer: 59 | Admitting: Internal Medicine

## 2021-10-14 ENCOUNTER — Encounter: Payer: Self-pay | Admitting: Internal Medicine

## 2021-10-14 VITALS — BP 126/84 | HR 84 | Temp 98.1°F | Resp 16 | Ht 66.0 in | Wt 201.1 lb

## 2021-10-14 DIAGNOSIS — K219 Gastro-esophageal reflux disease without esophagitis: Secondary | ICD-10-CM | POA: Diagnosis not present

## 2021-10-14 DIAGNOSIS — R5383 Other fatigue: Secondary | ICD-10-CM | POA: Diagnosis not present

## 2021-10-14 DIAGNOSIS — Z9989 Dependence on other enabling machines and devices: Secondary | ICD-10-CM | POA: Diagnosis not present

## 2021-10-14 DIAGNOSIS — G4733 Obstructive sleep apnea (adult) (pediatric): Secondary | ICD-10-CM | POA: Diagnosis not present

## 2021-10-14 LAB — COMPREHENSIVE METABOLIC PANEL
ALT: 73 U/L — ABNORMAL HIGH (ref 0–53)
AST: 29 U/L (ref 0–37)
Albumin: 4.5 g/dL (ref 3.5–5.2)
Alkaline Phosphatase: 80 U/L (ref 39–117)
BUN: 11 mg/dL (ref 6–23)
CO2: 24 mEq/L (ref 19–32)
Calcium: 9.4 mg/dL (ref 8.4–10.5)
Chloride: 105 mEq/L (ref 96–112)
Creatinine, Ser: 1.04 mg/dL (ref 0.40–1.50)
GFR: 85.39 mL/min (ref >60.00)
Glucose, Bld: 85 mg/dL (ref 70–99)
Potassium: 4.3 mEq/L (ref 3.5–5.1)
Sodium: 137 mEq/L (ref 135–145)
Total Bilirubin: 0.4 mg/dL (ref 0.2–1.2)
Total Protein: 7.3 g/dL (ref 6.0–8.3)

## 2021-10-14 LAB — CBC WITH DIFFERENTIAL/PLATELET
Basophils Absolute: 0 10*3/uL (ref 0.0–0.1)
Basophils Relative: 0.7 % (ref 0.0–3.0)
Eosinophils Absolute: 0.2 10*3/uL (ref 0.0–0.7)
Eosinophils Relative: 3.7 % (ref 0.0–5.0)
HCT: 44.7 % (ref 39.0–52.0)
Hemoglobin: 15.1 g/dL (ref 13.0–17.0)
Lymphocytes Relative: 38.6 % (ref 12.0–46.0)
Lymphs Abs: 2.2 10*3/uL (ref 0.7–4.0)
MCHC: 33.8 g/dL (ref 30.0–36.0)
MCV: 88.4 fl (ref 78.0–100.0)
Monocytes Absolute: 0.6 10*3/uL (ref 0.1–1.0)
Monocytes Relative: 11.3 % (ref 3.0–12.0)
Neutro Abs: 2.6 10*3/uL (ref 1.4–7.7)
Neutrophils Relative %: 45.7 % (ref 43.0–77.0)
Platelets: 338 10*3/uL (ref 150.0–400.0)
RBC: 5.06 Mil/uL (ref 4.22–5.81)
RDW: 13 % (ref 11.5–15.5)
WBC: 5.6 10*3/uL (ref 4.0–10.5)

## 2021-10-14 LAB — TSH: TSH: 1.61 u[IU]/mL (ref 0.35–5.50)

## 2021-10-14 MED ORDER — PANTOPRAZOLE SODIUM 40 MG PO TBEC: 40.0000 mg | DELAYED_RELEASE_TABLET | Freq: Every morning | ORAL | 6 refills | Status: DC

## 2021-10-14 NOTE — Patient Instructions (Addendum)
For acid reflux:  -Take pantoprazole 40 mg every morning before breakfast  -Avoid heavy meals after 4 PM.  Avoid meals or food that trigger acid reflux   Use your CPAP every night    GO TO THE LAB : Get the blood work     GO TO THE FRONT DESK, [place] back for a physical exam in 6 weeks

## 2021-10-14 NOTE — Progress Notes (Signed)
Subjective:    Patient ID: Zachary Kerr, male    DOB: Feb 04, 1976, 46 y.o.   MRN: [id number]  DOS:  10/14/2021 Type of visit - description: Acute.  Last visit 07-2020.  History of GERD, the patient reports that for the last 6 months symptoms have increased. Severe heartburn, typically at night, usually worse if he eats late at night, sometimes associated with nausea. Denies any fever chills. No unusual weight loss No exertional symptoms specifically no chest pain, DOE or difficulty breathing. Not taking NSAIDs. Uses PPIs OTC as needed only.  Also reports he is very fatigued and sleepy throughout the day. Reports he does not use the CPAP daily, at most 4 out of 7 nights a week.  Wt Readings from Last 3 Encounters:  10/14/21 201 lb 2 oz (91.2 kg)  01/17/21 205 lb 1.6 oz (93 kg)  08/21/20 206 lb 6 oz (93.6 kg)     Review of Systems  Also denies odynophagia but has occasionally dysphagia. Stool color is normal, from time to time sees red blood in the stools related to hemorrhoids.  Again, denies  melena.  Past Medical History:  Diagnosis Date   Arthritis of left acromioclavicular joint    Environmental allergies    GERD (gastroesophageal reflux disease)    Hemorrhoids    History of 2019 novel coronavirus disease (COVID-19) 09/2019  and 01/ 2022   per pt both times mild symptoms that resolved,  pt does not have results for 01/ 2022   Left shoulder pain    OSA on CPAP    followed by dr Frances Furbish--- studay in epic 03-06-2019, moderate osa,, pt stated uses 5 days per week    Past Surgical History:  Procedure Laterality Date   ROTATOR CUFF REPAIR Right 2010    Current Outpatient Medications  Medication Instructions   pantoprazole (PROTONIX) 40 mg, Oral, Every morning       Objective:   Physical Exam BP 126/84 (BP Location: Left Arm, Patient Position: Sitting, Cuff Size: Small)    Pulse 84    Temp 98.1 F (36.7 C) (Oral)    Resp 16    Ht 5\' 6"  (1.676 m)    Wt 201 lb 2 oz  (91.2 kg)    SpO2 98%    BMI 32.46 kg/m  General:   Well developed, NAD, BMI noted.  HEENT:  Normocephalic . Face symmetric, atraumatic Lungs:  CTA B Normal respiratory effort, no intercostal retractions, no accessory muscle use. Heart: RRR,  no murmur.  Abdomen:  Not distended, soft, non-tender. No rebound or rigidity.   Skin: Not pale. Not jaundice Lower extremities: no pretibial edema bilaterally  Neurologic:  alert & oriented X3.  Speech normal, gait appropriate for age and unassisted Psych--  Cognition and judgment appear intact.  Cooperative with normal attention span and concentration.  Behavior appropriate. No anxious or depressed appearing.     Assessment     ASSESSMENT GERD OSA DX 2020 Internal hemorrhoids  Plan: GERD: Long history of this issue, not well controlled for the last 6 months, on OTC PPIs as needed. Plan: Pantoprazole every day regardless of symptoms. Precautions extensively discussed, avoid triggers, avoid eating late. Reassess on RTC, if not better consider GI Fatigue: Reports severe fatigue, Epworth scale is 17 which is quite elevated.  Admits he is using the CPAP only 4 times a week. We agreed on the following: CMP, CBC, TSH. Rec to use CPAP every night. Reassess on RTC. OSA: See above RTC  6 weeks CPX   This visit occurred during the SARS-CoV-2 public health emergency.  Safety protocols were in place, including screening questions prior to the visit, additional usage of staff PPE, and extensive cleaning of exam room while observing appropriate contact time as indicated for disinfecting solutions.

## 2021-10-14 NOTE — Assessment & Plan Note (Signed)
GERD: Long history of this issue, not well controlled for the last 6 months, on OTC PPIs as needed. Plan: Pantoprazole every day regardless of symptoms. Precautions extensively discussed, avoid triggers, avoid eating late. Reassess on RTC, if not better consider GI Fatigue: Reports severe fatigue, Epworth scale is 17 which is quite elevated.  Admits he is using the CPAP only 4 times a week. We agreed on the following: CMP, CBC, TSH. Rec to use CPAP every night. Reassess on RTC. OSA: See above RTC 6 weeks CPX

## 2021-10-15 ENCOUNTER — Telehealth: Payer: Self-pay

## 2021-10-15 MED ORDER — OMEPRAZOLE 40 MG PO CPDR: 40.0000 mg | DELAYED_RELEASE_CAPSULE | Freq: Every day | ORAL | 6 refills | Status: DC

## 2021-10-15 NOTE — Telephone Encounter (Signed)
Rx sent 

## 2021-10-15 NOTE — Telephone Encounter (Signed)
Okay, changed to omeprazole 40 mg 1 tablet daily.

## 2021-10-15 NOTE — Telephone Encounter (Signed)
Pantoprazole not covered by Pt's insurance- alternatives: omeprazole, lansoprazole, esomeprazole

## 2021-12-11 ENCOUNTER — Encounter: Payer: 59 | Admitting: Internal Medicine

## 2021-12-25 ENCOUNTER — Ambulatory Visit (INDEPENDENT_AMBULATORY_CARE_PROVIDER_SITE_OTHER): Payer: 59 | Admitting: Internal Medicine

## 2021-12-25 ENCOUNTER — Encounter: Payer: Self-pay | Admitting: Internal Medicine

## 2021-12-25 VITALS — BP 118/74 | HR 74 | Temp 97.8°F | Resp 16 | Ht 66.0 in | Wt 211.2 lb

## 2021-12-25 DIAGNOSIS — Z0000 Encounter for general adult medical examination without abnormal findings: Secondary | ICD-10-CM

## 2021-12-25 DIAGNOSIS — G4733 Obstructive sleep apnea (adult) (pediatric): Secondary | ICD-10-CM

## 2021-12-25 DIAGNOSIS — Z9989 Dependence on other enabling machines and devices: Secondary | ICD-10-CM

## 2021-12-25 DIAGNOSIS — R7989 Other specified abnormal findings of blood chemistry: Secondary | ICD-10-CM

## 2021-12-25 DIAGNOSIS — R739 Hyperglycemia, unspecified: Secondary | ICD-10-CM

## 2021-12-25 DIAGNOSIS — K219 Gastro-esophageal reflux disease without esophagitis: Secondary | ICD-10-CM | POA: Diagnosis not present

## 2021-12-25 LAB — LIPID PANEL
Cholesterol: 187 mg/dL (ref 0–200)
HDL: 51.9 mg/dL (ref >39.00)
NonHDL: 134.87
Total CHOL/HDL Ratio: 4
Triglycerides: 205 mg/dL — ABNORMAL HIGH (ref 0.0–149.0)
VLDL: 41 mg/dL — ABNORMAL HIGH (ref 0.0–40.0)

## 2021-12-25 LAB — HEPATIC FUNCTION PANEL
ALT: 62 U/L — ABNORMAL HIGH (ref 0–53)
AST: 30 U/L (ref 0–37)
Albumin: 4.3 g/dL (ref 3.5–5.2)
Alkaline Phosphatase: 68 U/L (ref 39–117)
Bilirubin, Direct: 0.1 mg/dL (ref 0.0–0.3)
Total Bilirubin: 0.8 mg/dL (ref 0.2–1.2)
Total Protein: 7.1 g/dL (ref 6.0–8.3)

## 2021-12-25 LAB — LDL CHOLESTEROL, DIRECT: Direct LDL: 120 mg/dL

## 2021-12-25 LAB — HEMOGLOBIN A1C: Hgb A1c MFr Bld: 5.9 % (ref 4.6–6.5)

## 2021-12-25 MED ORDER — OMEPRAZOLE 40 MG PO CPDR: 40.0000 mg | DELAYED_RELEASE_CAPSULE | Freq: Every day | ORAL | 6 refills | Status: DC

## 2021-12-25 MED ORDER — HYDROCORTISONE 2.5 % EX CREA: TOPICAL_CREAM | Freq: Two times a day (BID) | CUTANEOUS | 3 refills | Status: AC | PRN

## 2021-12-25 NOTE — Patient Instructions (Addendum)
Return the stool cards at your convenience ? ?We are referring you to neurology for a CPAP evaluation ? ?  ? ?GO TO THE LAB : Get the blood work   ? ? ?[place], [place] ?Come back for a physical exam in 1 year ?

## 2021-12-25 NOTE — Progress Notes (Signed)
? ?Subjective:  ? ? Patient ID: Zachary Kerr, male    DOB: [birth year], 46 y.o.   MRN: [id number] ? ?DOS:  12/25/2021 ?Type of visit - description: cpx ? ?Since the last office visit is doing well. ?GERD well-controlled. ?Uses CPAP regularly, however he continues to be fatigued and occasionally somnolent. ? ? ?Review of Systems ? ?Other than above, a 14 point review of systems is negative  ? ?  ? ?Past Medical History:  ?Diagnosis Date  ? Arthritis of left acromioclavicular joint   ? Environmental allergies   ? GERD (gastroesophageal reflux disease)   ? Hemorrhoids   ? History of 2019 novel coronavirus disease (COVID-19) 09/2019  and 01/ 2022  ? per pt both times mild symptoms that resolved,  pt does not have results for 01/ 2022  ? Left shoulder pain   ? OSA on CPAP   ? followed by dr Rexene Alberts--- studay in epic 03-06-2019, moderate osa,, pt stated uses 5 days per week  ? ? ?Past Surgical History:  ?Procedure Laterality Date  ? ROTATOR CUFF REPAIR Right 2010  ? SHOULDER ARTHROSCOPY Left 12/2020  ? ?Social History  ? ?Socioeconomic History  ? Marital status: Married  ?  Spouse name: Not on file  ? Number of children: 3  ? Years of education: Not on file  ? Highest education level: Not on file  ?Occupational History  ? Occupation: Freight forwarder   ?Tobacco Use  ? Smoking status: Never  ? Smokeless tobacco: Never  ?Vaping Use  ? Vaping Use: Never used  ?Substance and Sexual Activity  ? Alcohol use: Not Currently  ?  Comment: socially   ? Drug use: Never  ? Sexual activity: Not on file  ?Other Topics Concern  ? Not on file  ?Social History Narrative  ? From [place]   ? Household: pt, wife, 3 children   ? ?Social Determinants of Health  ? ?Financial Resource Strain: Not on file  ?Food Insecurity: Not on file  ?Transportation Needs: Not on file  ?Physical Activity: Not on file  ?Stress: Not on file  ?Social Connections: Not on file  ?Intimate Partner Violence: Not on file  ? ? ?Current Outpatient Medications  ?Medication  Instructions  ? hydrocortisone 2.5 % cream Topical, 2 times daily PRN  ? omeprazole (PRILOSEC) 40 mg, Oral, Daily before breakfast  ? ? ?   ?Objective:  ? Physical Exam ?BP 118/74 (BP Location: Left Arm, Patient Position: Sitting, Cuff Size: Small)   Pulse 74   Temp 97.8 ?F (36.6 ?C) (Oral)   Resp 16   Ht 5\' 6"  (1.676 m)   Wt 211 lb 4 oz (95.8 kg)   SpO2 99%   BMI 34.10 kg/m?  ?General: ?Well developed, NAD, BMI noted ?Neck: No  thyromegaly  ?HEENT:  ?Normocephalic . Face symmetric, atraumatic ?Lungs:  ?CTA B ?Normal respiratory effort, no intercostal retractions, no accessory muscle use. ?Heart: RRR,  no murmur.  ?Abdomen:  ?Not distended, soft, non-tender. No rebound or rigidity.   ?Lower extremities: no pretibial edema bilaterally  ?Skin: Exposed areas without rash. Not pale. Not jaundice ?Neurologic:  ?alert & oriented X3.  ?Speech normal, gait appropriate for age and unassisted ?Strength symmetric and appropriate for age.  ?Psych: ?Cognition and judgment appear intact.  ?Cooperative with normal attention span and concentration.  ?Behavior appropriate. ?No anxious or depressed appearing. ? ?   ?Assessment   ? ?ASSESSMENT ?GERD ?OSA DX 2020 ?Internal hemorrhoids ? ? ?PLAN ?Here for  CPX ?GERD: Well-controlled, RF PPIs. ?OSA: Reports good CPAP compliance, still gets fatigue and occasional somnolence.  Refer to neurology, might need CPAP adjusted ?Eczema: Reports eczema at the abdomen on and off related to be in contact with the belt.  Rx sent. ?Increased LFTs: modestly elevated on and off, does not use Tylenol or EtOH.  Hepatitis serology negative except for hep B surface antibody consistent with previous immunizations.  Recheck LFTs, consider liver ultrasound.  Patient aware. ?RTC 1 year. ? ? ? ?This visit occurred during the SARS-CoV-2 public health emergency.  Safety protocols were in place, including screening questions prior to the visit, additional usage of staff PPE, and extensive cleaning of exam  room while observing appropriate contact time as indicated for disinfecting solutions.  ? ?

## 2021-12-26 ENCOUNTER — Encounter: Payer: Self-pay | Admitting: Internal Medicine

## 2021-12-26 NOTE — Assessment & Plan Note (Signed)
-  Td 2022 ?-Covid vax: d/w pt  ?-CCS: No family history of colon cancer.    3 options discussed, elected IFOB  ?- Prostate cancer screening: Not indicated ?-Diet and exercise  d/w pt  ?-Labs LFTs, FLP, A1c, ifob ?

## 2021-12-26 NOTE — Assessment & Plan Note (Signed)
Here for CPX ?GERD: Well-controlled, RF PPIs. ?OSA: Reports good CPAP compliance, still gets fatigue and occasional somnolence.  Refer to neurology, might need CPAP adjusted ?Eczema: Reports eczema at the abdomen on and off related to be in contact with the belt.  Rx sent. ?Increased LFTs: modestly elevated on and off, does not use Tylenol or EtOH.  Hepatitis serology negative except for hep B surface antibody consistent with previous immunizations.  Recheck LFTs, consider liver ultrasound.  Patient aware. ?RTC 1 year. ?

## 2021-12-29 NOTE — Addendum Note (Signed)
Addended byConrad Ventana D on: 12/29/2021 11:40 AM ? ? Modules accepted: Orders ? ?

## 2022-01-05 ENCOUNTER — Other Ambulatory Visit: Payer: Self-pay

## 2022-01-05 MED ORDER — OMEPRAZOLE 20 MG PO CPDR: 20.0000 mg | DELAYED_RELEASE_CAPSULE | Freq: Two times a day (BID) | ORAL | 1 refills | Status: DC

## 2022-01-09 ENCOUNTER — Ambulatory Visit (HOSPITAL_BASED_OUTPATIENT_CLINIC_OR_DEPARTMENT_OTHER): Payer: 59

## 2022-01-26 ENCOUNTER — Encounter: Payer: Self-pay | Admitting: Internal Medicine

## 2022-05-25 ENCOUNTER — Encounter: Payer: Self-pay | Admitting: Internal Medicine

## 2022-11-18 ENCOUNTER — Ambulatory Visit: Payer: 59 | Admitting: Internal Medicine

## 2022-11-18 ENCOUNTER — Encounter: Payer: Self-pay | Admitting: Internal Medicine

## 2022-11-18 VITALS — BP 132/80 | HR 87 | Temp 98.0°F | Resp 16 | Ht 66.0 in | Wt 212.0 lb

## 2022-11-18 DIAGNOSIS — R7989 Other specified abnormal findings of blood chemistry: Secondary | ICD-10-CM | POA: Diagnosis not present

## 2022-11-18 DIAGNOSIS — K921 Melena: Secondary | ICD-10-CM

## 2022-11-18 DIAGNOSIS — K649 Unspecified hemorrhoids: Secondary | ICD-10-CM

## 2022-11-18 LAB — CBC WITH DIFFERENTIAL/PLATELET
Basophils Absolute: 0 10*3/uL (ref 0.0–0.1)
Basophils Relative: 0.4 % (ref 0.0–3.0)
Eosinophils Absolute: 0.2 10*3/uL (ref 0.0–0.7)
Eosinophils Relative: 3.9 % (ref 0.0–5.0)
HCT: 35.7 % — ABNORMAL LOW (ref 39.0–52.0)
Hemoglobin: 12.1 g/dL — ABNORMAL LOW (ref 13.0–17.0)
Lymphocytes Relative: 45.8 % (ref 12.0–46.0)
Lymphs Abs: 2.7 10*3/uL (ref 0.7–4.0)
MCHC: 33.8 g/dL (ref 30.0–36.0)
MCV: 84.6 fl (ref 78.0–100.0)
Monocytes Absolute: 0.6 10*3/uL (ref 0.1–1.0)
Monocytes Relative: 10.9 % (ref 3.0–12.0)
Neutro Abs: 2.3 10*3/uL (ref 1.4–7.7)
Neutrophils Relative %: 39 % — ABNORMAL LOW (ref 43.0–77.0)
Platelets: 386 10*3/uL (ref 150.0–400.0)
RBC: 4.22 Mil/uL (ref 4.22–5.81)
RDW: 13.2 % (ref 11.5–15.5)
WBC: 5.9 10*3/uL (ref 4.0–10.5)

## 2022-11-18 LAB — FERRITIN: Ferritin: 8.6 ng/mL — ABNORMAL LOW (ref 22.0–322.0)

## 2022-11-18 MED ORDER — HYDROCORTISONE ACETATE 25 MG RE SUPP: 25.0000 mg | Freq: Two times a day (BID) | RECTAL | 1 refills | Status: DC | PRN

## 2022-11-18 NOTE — Progress Notes (Signed)
   Subjective:    Patient ID: Zachary Kerr, male    DOB: August 12, 1976, 47 y.o.   MRN: [id number]  DOS:  11/18/2022 Type of visit - description: Acute  Has a long history of internal hemorrhoids, bleeding increased for the last 6 months: It is happening several times a day, not necessarily associated with BMs. Sometimes it feels abundant.  Denies ano- rectal  pain or itching. No fever chills No nausea vomiting.  No abdominal pain Stools are actually normal in color, no diarrhea, they are not mixed with blood.  Wt Readings from Last 3 Encounters:  11/18/22 212 lb (96.2 kg)  12/25/21 211 lb 4 oz (95.8 kg)  10/14/21 201 lb 2 oz (91.2 kg)    Review of Systems See above   Past Medical History:  Diagnosis Date   Arthritis of left acromioclavicular joint    Environmental allergies    GERD (gastroesophageal reflux disease)    Hemorrhoids    History of 2019 novel coronavirus disease (COVID-19) 09/2019  and 01/ 2022   per pt both times mild symptoms that resolved,  pt does not have results for 01/ 2022   Left shoulder pain    OSA on CPAP    followed by dr Rexene Alberts--- studay in epic 03-06-2019, moderate osa,, pt stated uses 5 days per week    Past Surgical History:  Procedure Laterality Date   ROTATOR CUFF REPAIR Right 2010   SHOULDER ARTHROSCOPY Left 12/2020    Current Outpatient Medications  Medication Instructions   hydrocortisone 2.5 % cream Topical, 2 times daily PRN   omeprazole (PRILOSEC) 20 mg, Oral, 2 times daily before meals       Objective:   Physical Exam BP 132/80   Pulse 87   Temp 98 F (36.7 C) (Oral)   Resp 16   Ht 5' 6"$  (1.676 m)   Wt 212 lb (96.2 kg)   SpO2 97%   BMI 34.22 kg/m  General:   Well developed, NAD, BMI noted.  HEENT:  Normocephalic . Face symmetric, atraumatic DRE: Normal sphincter tone, no stools found, no blood.  Prostate nontender. Procedure note: With the patient verbal permission I introduced self lighted well-lubricated anoscope. I  noted several internal hemorrhoids, with evidence of recent bleeding. Abdomen:  Not distended, soft, non-tender. No rebound or rigidity.   Skin: Not pale. Not jaundice Lower extremities: no pretibial edema bilaterally  Neurologic:  alert & oriented X3.  Speech normal, gait appropriate for age and unassisted Psych--  Cognition and judgment appear intact.  Cooperative with normal attention span and concentration.  Behavior appropriate. No anxious or depressed appearing.     Assessment     ASSESSMENT GERD OSA DX 2020 Internal hemorrhoids Increased LFTs, denies Tylenol or EtOH - Hep B, hep C serology negative (2020)  PLAN Internal hemorrhoids:  Refer to GI 2021, referral failed   Symptoms increased for the last 6 months, having what he describes as severe bleeding.  Physical exam and anoscopy confirmed internal hemorrhoids. Needs further eval: GI referral.  Check CBC, iron, ferritin.  Anusol HC as needed. Increased LFTs: Noted last year, Rx liver US, referral failed.  Will try again.  He does not drink etoh , no  Tylenol. Prediabetes: Last A1c 5.9. All instructions were clearly discussed in Spanish. RTC in few weeks for CPX

## 2022-11-18 NOTE — Assessment & Plan Note (Signed)
Internal hemorrhoids:  Refer to GI 2021, referral failed   Symptoms increased for the last 6 months, having what he describes as severe bleeding.  Physical exam and anoscopy confirmed internal hemorrhoids. Needs further eval: GI referral.  Check CBC, iron, ferritin.  Anusol HC as needed. Increased LFTs: Noted last year, Rx liver US, referral failed.  Will try again.  He does not drink etoh , no  Tylenol. Prediabetes: Last A1c 5.9. All instructions were clearly discussed in Spanish. RTC in few weeks for CPX

## 2022-11-18 NOTE — Patient Instructions (Addendum)
Saquese la sangre antes de irse  Saque un cita para su examen general en unas semanas   En el primer piso saque una cita para un ultrasonido del higado  Lo refiero a "GASTROENTEROLGY" para una possible colonoscopia Llame a  [phone] [phone]  Use los supositorios una o 2 veces al dia

## 2022-11-19 LAB — HEPATIC FUNCTION PANEL
ALT: 52 U/L (ref 0–53)
AST: 23 U/L (ref 0–37)
Albumin: 4.2 g/dL (ref 3.5–5.2)
Alkaline Phosphatase: 67 U/L (ref 39–117)
Bilirubin, Direct: 0.1 mg/dL (ref 0.0–0.3)
Total Bilirubin: 0.5 mg/dL (ref 0.2–1.2)
Total Protein: 7 g/dL (ref 6.0–8.3)

## 2022-11-19 LAB — IRON: Iron: 44 ug/dL (ref 42–165)

## 2022-11-20 ENCOUNTER — Ambulatory Visit (HOSPITAL_BASED_OUTPATIENT_CLINIC_OR_DEPARTMENT_OTHER): Payer: 59

## 2022-11-23 MED ORDER — FERROUS FUMARATE 324 (106 FE) MG PO TABS: 1.0000 | ORAL_TABLET | Freq: Every day | ORAL | 1 refills | Status: DC

## 2022-11-23 NOTE — Addendum Note (Signed)
Addended byDamita Dunnings D on: 11/23/2022 09:19 AM   Modules accepted: Orders

## 2023-01-14 ENCOUNTER — Other Ambulatory Visit (INDEPENDENT_AMBULATORY_CARE_PROVIDER_SITE_OTHER): Payer: 59

## 2023-01-14 ENCOUNTER — Ambulatory Visit: Payer: 59 | Admitting: Gastroenterology

## 2023-01-14 ENCOUNTER — Encounter: Payer: Self-pay | Admitting: Gastroenterology

## 2023-01-14 VITALS — BP 120/80 | HR 80 | Ht 65.0 in | Wt 216.0 lb

## 2023-01-14 DIAGNOSIS — R7401 Elevation of levels of liver transaminase levels: Secondary | ICD-10-CM

## 2023-01-14 DIAGNOSIS — K921 Melena: Secondary | ICD-10-CM | POA: Diagnosis not present

## 2023-01-14 DIAGNOSIS — D509 Iron deficiency anemia, unspecified: Secondary | ICD-10-CM

## 2023-01-14 DIAGNOSIS — K219 Gastro-esophageal reflux disease without esophagitis: Secondary | ICD-10-CM | POA: Diagnosis not present

## 2023-01-14 LAB — BASIC METABOLIC PANEL
BUN: 13 mg/dL (ref 6–23)
CO2: 26 mEq/L (ref 19–32)
Calcium: 9.3 mg/dL (ref 8.4–10.5)
Chloride: 106 mEq/L (ref 96–112)
Creatinine, Ser: 1.03 mg/dL (ref 0.40–1.50)
GFR: 85.64 mL/min (ref >60.00)
Glucose, Bld: 100 mg/dL — ABNORMAL HIGH (ref 70–99)
Potassium: 3.9 mEq/L (ref 3.5–5.1)
Sodium: 138 mEq/L (ref 135–145)

## 2023-01-14 LAB — GAMMA GT: GGT: 43 U/L (ref 7–51)

## 2023-01-14 LAB — PROTIME-INR
INR: 1 ratio (ref 0.8–1.0)
Prothrombin Time: 10.7 s (ref 9.6–13.1)

## 2023-01-14 MED ORDER — PLENVU 140 G PO SOLR: ORAL | 0 refills | Status: DC

## 2023-01-14 NOTE — Progress Notes (Signed)
HPI : Zachary Kerr is a very pleasant 47 year old male with a history of obstructive sleep apnea who is referred to [place] by Dr. Porfirio Oar for further evaluation of painless hematochezia.  Patient states that he has been seeing blood in his stool frequently for many months now.  The blood is always bright red, and sometimes large in quantity.  He would sometimes see blood clots, and sometimes he would just pass blood without any stool.  He denied any pain with the passage of stool.  He denies any other symptoms such as constipation, diarrhea or abdominal pain. His weight is stable.  His appetite is good.  He has no family history of GI malignancy.  He denies feeling any lumps, bumps or protrusions around the anus.  He denies symptoms of perianal itching or burning. He has never had a colonoscopy.  He also has a history of chronic typical GERD symptoms.  He has been dealing with these symptoms for many years, but is not taking medications.  His symptoms consist of heartburn and acid regurgitation.  Sometimes he wakes up at night with burning pain in his throat and severe nausea and vomiting.  Will no dysphagia.  He recently had labs checked by his PCP which were notable for a hemoglobin of 12 (down from 15 last January).  Ferritin was 8.6.  He also has chronically elevated liver enzymes in a hepatocellular pattern, ALT predominant, with ALT ranging in the 50s to 80s.  Liver enzymes first noted to be elevated in 2018. He has no known family history of liver disease.  No history of heavy alcohol use.  He drinks alcohol on very rare occasions.   Past Medical History:  Diagnosis Date   Arthritis of left acromioclavicular joint    Environmental allergies    GERD (gastroesophageal reflux disease)    Hemorrhoids    History of 2019 novel coronavirus disease (COVID-19) 09/2019  and 01/ 2022   per pt both times mild symptoms that resolved,  pt does not have results for 01/ 2022   Left shoulder pain    OSA on  CPAP    followed by dr Frances Furbish--- studay in epic 03-06-2019, moderate osa,, pt stated uses 5 days per week     Past Surgical History:  Procedure Laterality Date   ROTATOR CUFF REPAIR Right 2010   SHOULDER ARTHROSCOPY Left 12/2020   Family History  Problem Relation Age of Onset   Alcohol abuse Father        GF   Benign prostatic hyperplasia Other        auncle    Diabetes Neg Hx    Hypertension Neg Hx    Heart attack Neg Hx    Colon cancer Neg Hx    Prostate cancer Neg Hx    Social History   Tobacco Use   Smoking status: Never   Smokeless tobacco: Never  Vaping Use   Vaping Use: Never used  Substance Use Topics   Alcohol use: Not Currently    Comment: socially    Drug use: Never  Works at [employer] Current Outpatient Medications  Medication Sig Dispense Refill   Ferrous Fumarate (HEMOCYTE - 106 MG FE) 324 (106 Fe) MG TABS tablet Take 1 tablet (106 mg of iron total) by mouth daily. 90 tablet 1   hydrocortisone (ANUSOL-HC) 25 MG suppository Place 1 suppository (25 mg total) rectally 2 (two) times daily as needed for hemorrhoids. 12 suppository 1   omeprazole (PRILOSEC) 20 MG capsule  Take 1 capsule (20 mg total) by mouth 2 (two) times daily before a meal. 168 capsule 1   No current facility-administered medications for this visit.   Allergies  Allergen Reactions   Other     Belt buckles, watch band and necklaces break with a rash, nickel?     Review of Systems: All systems reviewed and negative except where noted in HPI.    No results found.  Physical Exam: BP 120/80   Pulse 80   Ht  (1.651 m)   Wt 216 lb (98 kg)   BMI 35.94 kg/m  Constitutional: Pleasant,well-developed, Hispanic male in no acute distress. HEENT: Normocephalic and atraumatic. Conjunctivae are normal. No scleral icterus. Neck supple.  Cardiovascular: Normal rate, regular rhythm.  Pulmonary/chest: Effort normal and breath sounds normal. No wheezing, rales or rhonchi. Abdominal: Soft,  nondistended, nontender. Bowel sounds active throughout. There are no masses palpable. No hepatomegaly. Extremities: no edema Neurological: Alert and oriented to person place and time. Skin: Skin is warm and dry. No rashes noted. Psychiatric: Normal mood and affect. Behavior is normal.  CBC    Component Value Date/Time   WBC 5.9 11/18/2022 1146   RBC 4.22 11/18/2022 1146   HGB 12.1 (L) 11/18/2022 1146   HCT 35.7 (L) 11/18/2022 1146   PLT 386.0 11/18/2022 1146   MCV 84.6 11/18/2022 1146   MCHC 33.8 11/18/2022 1146   RDW 13.2 11/18/2022 1146   LYMPHSABS 2.7 11/18/2022 1146   MONOABS 0.6 11/18/2022 1146   EOSABS 0.2 11/18/2022 1146   BASOSABS 0.0 11/18/2022 1146    CMP     Component Value Date/Time   NA 137 10/14/2021 1114   K 4.3 10/14/2021 1114   CL 105 10/14/2021 1114   CO2 24 10/14/2021 1114   GLUCOSE 85 10/14/2021 1114   BUN 11 10/14/2021 1114   CREATININE 1.04 10/14/2021 1114   CALCIUM 9.4 10/14/2021 1114   PROT 7.0 11/18/2022 1146   ALBUMIN 4.2 11/18/2022 1146   AST 23 11/18/2022 1146   ALT 52 11/18/2022 1146   ALKPHOS 67 11/18/2022 1146   BILITOT 0.5 11/18/2022 1146   GFRNONAA 101.13 09/18/2010 9147     ASSESSMENT AND PLAN:  47 year old male with recent onset painless hematochezia, with new iron deficiency anemia.  He also has chronically elevated liver enzymes in a hepatocellular pattern, ALT predominant.  No history of heavy alcohol use. His description of his hematochezia seems most consistent with bleeding from internal hemorrhoids. We discussed the anatomy and physiology of hemorrhoids, as well as the principles of hemorrhoid management to include optimization of bowel habits, with a goal of passing a soft formed stool daily without straining, avoidance of hard stools and straining, limiting time on the toilet to less than 5 minutes.  We discussed the role of fiber in optimizing the stool bulk and consistency.  We also discussed the role of hemorrhoid banding  for persistent symptoms. A colonoscopy is warranted to evaluate his hematochezia and iron deficiency anemia. The patient has chronic typical GERD symptoms, but given his concomitant iron deficiency anemia, upper endoscopy is warranted to evaluate for other causes of chronic GI blood loss/iron malabsorption. We discussed the pathophysiology of GERD and the principles of GERD management to include lifestyle modifications  such as dietary discretion (avoidance of alcohol, tobacco, caffeinated and carbonated beverages, spicy/greasy foods, citrus, peppermint/chocolate), weight loss if applicable, head of bed elevation andconsuming last meal of day within 3 hours of bedtime; pharmacologic options to include PPIs, H2RAs and  OTC antacids; and finally surgical or endoscopic fundoplication.  His chronic aminotransferase elevation is most likely secondary to metabolic associated steatotic liver disease, but will evaluate for other potential causes to include viral, autoimmune and genetic etiologies.  Will get right upper quadrant ultrasound to assess for steatosis.  Iron def anemia - EGD, colonoscopy  Hematochezia, likely hemorrhoidal - Colonoscopy - Minimal symptoms currently  GERD - EGD given Ida  Elevated ALT - RUQUS - R/o viral/autoimmune/genetic  The details, risks (including bleeding, perforation, infection, missed lesions, medication reactions and possible hospitalization or surgery if complications occur), benefits, and alternatives to EGD/colonoscopy with possible biopsy and possible polypectomy were discussed with the patient and he/she consents to proceed.   Lang Zingg E. Tomasa Rand, MD [facility]   Wanda Plump, MD

## 2023-01-14 NOTE — Patient Instructions (Signed)
_______________________________________________________  If your blood pressure at your visit was 140/90 or greater, please contact your primary care physician to follow up on this.  _______________________________________________________  If you are age 49 or older, your body mass index should be between 23-30. Your Body mass index is 35.94 kg/m. If this is out of the aforementioned range listed, please consider follow up with your Primary Care Provider.  If you are age 1 or younger, your body mass index should be between 19-25. Your Body mass index is 35.94 kg/m. If this is out of the aformentioned range listed, please consider follow up with your Primary Care Provider.   Your provider has requested that you go to the basement level for lab work before leaving today. Press "B" on the elevator. The lab is located at the first door on the left as you exit the elevator.  You have been scheduled for an abdominal ultrasound at [facility] on 01/18/23 at 8am. Please arrive 30 minutes prior to your appointment for registration. Make certain not to have anything to eat or drink 6 hours prior to your appointment. Should you need to reschedule your appointment, please contact radiology at [phone]. This test typically takes about 30 minutes to perform.   You have been scheduled for a colonoscopy. Please follow written instructions given to you at your visit today.  Please pick up your prep supplies at the pharmacy within the next 1-3 days. If you use inhalers (even only as needed), please bring them with you on the day of your procedure.   The [facility] providers would like to encourage you to use [facility] to communicate with providers for non-urgent requests or questions.  Due to long hold times on the telephone, sending your provider a message by [facility] may be a faster and more efficient way to get a response.  Please allow 48 business hours for a response.  Please remember that this is  for non-urgent requests.   It was a pleasure to see you today!  Thank you for trusting me with your gastrointestinal care!    Scott E.Tomasa Rand, MD

## 2023-01-15 LAB — ALPHA-1-ANTITRYPSIN: A-1 Antitrypsin, Ser: 113 mg/dL (ref 83–199)

## 2023-01-15 LAB — ANA: Anti Nuclear Antibody (ANA): NEGATIVE

## 2023-01-18 ENCOUNTER — Telehealth: Payer: Self-pay

## 2023-01-18 ENCOUNTER — Ambulatory Visit (HOSPITAL_BASED_OUTPATIENT_CLINIC_OR_DEPARTMENT_OTHER): Payer: 59

## 2023-01-18 NOTE — Telephone Encounter (Signed)
PA request received via CMM for Plenvu 140GM solution  Preferred alternatives are as follows:

## 2023-01-18 NOTE — Telephone Encounter (Signed)
Patient will come pick up sample prep tomorrow.

## 2023-01-19 LAB — TISSUE TRANSGLUTAMINASE, IGA: (tTG) Ab, IgA: <1 U/mL

## 2023-01-19 LAB — IGA: Immunoglobulin A: 296 mg/dL (ref 47–310)

## 2023-01-19 LAB — HEPATITIS B SURFACE ANTIBODY,QUALITATIVE: Hep B S Ab: REACTIVE — AB

## 2023-01-19 LAB — ANTI-SMOOTH MUSCLE ANTIBODY, IGG: Actin (Smooth Muscle) Antibody (IGG): <20 U (ref <20)

## 2023-01-19 LAB — HEPATITIS B SURFACE ANTIGEN: Hepatitis B Surface Ag: NONREACTIVE

## 2023-01-19 LAB — IGG: IgG (Immunoglobin G), Serum: 1332 mg/dL (ref 600–1640)

## 2023-01-19 LAB — CERULOPLASMIN: Ceruloplasmin: 25 mg/dL (ref 18–36)

## 2023-01-19 LAB — HEPATITIS C ANTIBODY: Hepatitis C Ab: NONREACTIVE

## 2023-01-19 LAB — HEPATITIS A ANTIBODY, TOTAL: Hepatitis A AB,Total: REACTIVE — AB

## 2023-01-19 LAB — MITOCHONDRIAL ANTIBODIES: Mitochondrial M2 Ab, IgG: <=20 U (ref <=20.0)

## 2023-01-20 ENCOUNTER — Encounter: Payer: 59 | Admitting: Internal Medicine

## 2023-01-22 ENCOUNTER — Ambulatory Visit (HOSPITAL_BASED_OUTPATIENT_CLINIC_OR_DEPARTMENT_OTHER)
Admission: RE | Admit: 2023-01-22 | Discharge: 2023-01-22 | Disposition: A | Discharge status: Home / Self Care | Payer: 59 | Source: Ambulatory Visit | Attending: Gastroenterology | Admitting: Gastroenterology

## 2023-01-22 DIAGNOSIS — D509 Iron deficiency anemia, unspecified: Secondary | ICD-10-CM | POA: Diagnosis present

## 2023-01-22 DIAGNOSIS — K219 Gastro-esophageal reflux disease without esophagitis: Secondary | ICD-10-CM | POA: Insufficient documentation

## 2023-01-22 DIAGNOSIS — K921 Melena: Secondary | ICD-10-CM | POA: Insufficient documentation

## 2023-01-22 DIAGNOSIS — R7401 Elevation of levels of liver transaminase levels: Secondary | ICD-10-CM | POA: Insufficient documentation

## 2023-01-25 NOTE — Progress Notes (Signed)
See results management attached with ultrasround

## 2023-01-25 NOTE — Progress Notes (Signed)
Zachary Kerr,  Your blood tests were all normal.  You are immune to hepatitis A and B (likely through previous vaccines).  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 As we discussed, this is a common condition of the liver whereby normal liver is replaced by fatty tissue.  In some cases this can lead to significant inflammation and scarring of the liver, including cirrhosis.  Treatment of MASLD is solely based on improving diet and exercise habits and weight loss.   Consumption of processed foods and sugar-based based foods seem to pose particular risk and you should limit these foods.  Please follow up with me in the clinic to discuss this diagnosis further.

## 2023-02-04 ENCOUNTER — Encounter: Payer: Self-pay | Admitting: Gastroenterology

## 2023-02-12 ENCOUNTER — Other Ambulatory Visit: Payer: 59

## 2023-02-12 ENCOUNTER — Ambulatory Visit (AMBULATORY_SURGERY_CENTER): Payer: 59 | Admitting: Gastroenterology

## 2023-02-12 ENCOUNTER — Encounter: Payer: Self-pay | Admitting: Gastroenterology

## 2023-02-12 VITALS — BP 124/80 | HR 80 | Temp 98.7°F | Resp 18 | Ht 65.0 in | Wt 216.0 lb

## 2023-02-12 DIAGNOSIS — D509 Iron deficiency anemia, unspecified: Secondary | ICD-10-CM

## 2023-02-12 DIAGNOSIS — K219 Gastro-esophageal reflux disease without esophagitis: Secondary | ICD-10-CM

## 2023-02-12 DIAGNOSIS — K2950 Unspecified chronic gastritis without bleeding: Secondary | ICD-10-CM | POA: Diagnosis not present

## 2023-02-12 DIAGNOSIS — B9681 Helicobacter pylori [H. pylori] as the cause of diseases classified elsewhere: Secondary | ICD-10-CM | POA: Diagnosis not present

## 2023-02-12 LAB — CBC
MCHC: 32.8 g/dL (ref 32.0–36.0)
MPV: 9.8 fL (ref 7.5–12.5)
RBC: 4.81 10*6/uL (ref 4.20–5.80)

## 2023-02-12 MED ORDER — SODIUM CHLORIDE 0.9 % IV SOLN
500.0000 mL | Freq: Once | INTRAVENOUS | Status: DC
Start: 2023-02-12 — End: 2023-02-12

## 2023-02-12 NOTE — Op Note (Signed)
[facility] Patient Name: Zachary Kerr Procedure Date: 02/12/2023 3:42 PM MRN: [id number] Endoscopist: Zachary Kerr , MD, [id number] Age: 47 Referring MD:  Date of Birth: 04/08/1976 Gender: Male Account #: [id number] Procedure:                Upper GI endoscopy Indications:              Iron deficiency anemia Medicines:                Monitored Anesthesia Care Procedure:                Pre-Anesthesia Assessment:                           - Prior to the procedure, a History and Physical                            was performed, and patient medications and                            allergies were reviewed. The patient's tolerance of                            previous anesthesia was also reviewed. The risks                            and benefits of the procedure and the sedation                            options and risks were discussed with the patient.                            All questions were answered, and informed consent                            was obtained. Prior Anticoagulants: The patient has                            taken no anticoagulant or antiplatelet agents. ASA                            Grade Assessment: II - A patient with mild systemic                            disease. After reviewing the risks and benefits,                            the patient was deemed in satisfactory condition to                            undergo the procedure.                           After obtaining informed consent, the endoscope was  passed under direct vision. Throughout the                            procedure, the patient's blood pressure, pulse, and                            oxygen saturations were monitored continuously. The                            Olympus Scope [id number] was introduced through the                            mouth, and advanced to the second part of duodenum.                            The upper GI endoscopy was  accomplished without                            difficulty. The patient tolerated the procedure                            well. Scope In: Scope Out: Findings:                 The examined esophagus was normal.                           The entire examined stomach was normal. Biopsies                            were taken with a cold forceps for Helicobacter                            pylori testing. Estimated blood loss was minimal.                           The examined duodenum was normal. Complications:            No immediate complications. Estimated Blood Loss:     Estimated blood loss was minimal. Impression:               - Normal esophagus.                           - Normal stomach. Biopsied.                           - Normal examined duodenum.                           - No endoscopic abnormalities to explain iron                            deficiency Recommendation:           - Patient has a contact number available for  emergencies. The signs and symptoms of potential                            delayed complications were discussed with the                            patient. Return to normal activities tomorrow.                            Written discharge instructions were provided to the                            patient.                           - Resume previous diet.                           - Continue present medications.                           - Await pathology results.                           - Repeat CBC, iron panel. Zachary Harlacher E. Tomasa Rand, MD 02/12/2023 4:19:20 PM This report has been signed electronically.

## 2023-02-12 NOTE — Op Note (Signed)
[facility] Patient Name: Zachary Kerr Procedure Date: 02/12/2023 3:37 PM MRN: [id number] Endoscopist: Lorin Picket E. Tomasa Rand , MD, [id number] Age: 47 Referring MD:  Date of Birth: Dec 31, 1975 Gender: Male Account #: [id number] Procedure:                Colonoscopy Indications:              Hematochezia Medicines:                Monitored Anesthesia Care Procedure:                Pre-Anesthesia Assessment:                           - Prior to the procedure, a History and Physical                            was performed, and patient medications and                            allergies were reviewed. The patient's tolerance of                            previous anesthesia was also reviewed. The risks                            and benefits of the procedure and the sedation                            options and risks were discussed with the patient.                            All questions were answered, and informed consent                            was obtained. Prior Anticoagulants: The patient has                            taken no anticoagulant or antiplatelet agents. ASA                            Grade Assessment: II - A patient with mild systemic                            disease. After reviewing the risks and benefits,                            the patient was deemed in satisfactory condition to                            undergo the procedure.                           After obtaining informed consent, the colonoscope  was passed under direct vision. Throughout the                            procedure, the patient's blood pressure, pulse, and                            oxygen saturations were monitored continuously. The                            CF [id number] #[id number] was introduced through the anus                            and advanced to the the cecum, identified by                            appendiceal orifice and ileocecal valve. The                             colonoscopy was performed without difficulty. The                            patient tolerated the procedure well. The quality                            of the bowel preparation was adequate. The                            ileocecal valve, appendiceal orifice, and rectum                            were photographed. The bowel preparation used was                            Plenvu via split dose instruction. Scope In: 3:57:26 PM Scope Out: 4:10:36 PM Scope Withdrawal Time: 0 hours 9 minutes 40 seconds  Total Procedure Duration: 0 hours 13 minutes 10 seconds  Findings:                 The perianal and digital rectal examinations were                            normal. Pertinent negatives include normal                            sphincter tone and no palpable rectal lesions.                           The colon (entire examined portion) appeared normal.                           Non-bleeding internal hemorrhoids were found during                            retroflexion. The hemorrhoids were large and Grade  I (internal hemorrhoids that do not prolapse).                           No additional abnormalities were found on                            retroflexion. Complications:            No immediate complications. Estimated Blood Loss:     Estimated blood loss: none. Impression:               - The entire examined colon is normal.                           - Non-bleeding internal hemorrhoids.                           - No specimens collected. Recommendation:           - Patient has a contact number available for                            emergencies. The signs and symptoms of potential                            delayed complications were discussed with the                            patient. Return to normal activities tomorrow.                            Written discharge instructions were provided to the                             patient.                           - Resume previous diet.                           - Continue present medications.                           - Repeat colonoscopy in 10 years for screening                            purposes.                           - Recommend daily fiber supplementation to reduce                            hemorrhoidal symptoms; can consider banding if                            desired. [name] E. Tomasa Rand, MD 02/12/2023 4:15:59 PM This report has been signed electronically.

## 2023-02-12 NOTE — Progress Notes (Unsigned)
History and Physical Interval Note:  02/12/2023 3:39 PM  Zachary Kerr  has presented today for endoscopic procedure(s), with the diagnosis of  Encounter Diagnoses  Name Primary?   Iron deficiency anemia, unspecified iron deficiency anemia type Yes   Gastroesophageal reflux disease, unspecified whether esophagitis present   .  The various methods of evaluation and treatment have been discussed with the patient and/or family. After consideration of risks, benefits and other options for treatment, the patient has consented to  the endoscopic procedure(s).   The patient's history has been reviewed, patient examined, no change in status, stable for endoscopic procedure(s).  I have reviewed the patient's chart and labs.  Questions were answered to the patient's satisfaction.     Dickson Kostelnik E. Tomasa Rand, MD [facility]

## 2023-02-12 NOTE — Progress Notes (Unsigned)
Called to room to assist during endoscopic procedure.  Patient ID and intended procedure confirmed with present staff. Received instructions for my participation in the procedure from the performing physician.  

## 2023-02-12 NOTE — Progress Notes (Unsigned)
A and O x3. Report to RN. Tolerated MAC anesthesia well.Teeth unchanged after procedure. 

## 2023-02-12 NOTE — Progress Notes (Signed)
Pt's states no medical or surgical changes since previsit or office visit. 

## 2023-02-12 NOTE — Patient Instructions (Addendum)
Resume previous diet Continue present medications Await pathology results There were no colon polyps seen today!   You will need another screening colonoscopy in 10 years, you will receive a letter at that time when you are due for the procedure.   Please call us at [phone] if you have a change in bowel habits, change in family history of colo-rectal cancer, rectal bleeding or other GI concern before that time. Visit Lab in basement on the way out, CBC and Iron Panel Handouts/information given for high fiber diet, fiber supplements, hemorrhoids, hemorrhoid banding  USTED TUVO UN PROCEDIMIENTO ENDOSCPICO HOY EN EL [facility]:   Lea el informe del procedimiento que se le entreg para cualquier pregunta especfica sobre lo que se Dentist.  Si el informe del examen no responde a sus preguntas, por favor llame a su gastroenterlogo para aclararlo.  Si usted solicit que no se le den [employer] de lo que se Clinical cytogeneticist en su procedimiento al [employer] va a cuidar, entonces el informe del procedimiento se ha incluido en un sobre sellado para que usted lo revise despus cuando le sea ms conveniente.   LO QUE PUEDE ESPERAR: Algunas sensaciones de hinchazn en el abdomen.  Puede tener ms gases de lo normal.  El caminar puede ayudarle a eliminar el aire que se le puso en el tracto gastrointestinal durante el procedimiento y reducir la hinchazn.  Si le hicieron una endoscopia inferior (como una colonoscopia o una sigmoidoscopia flexible), podra notar manchas de sangre en las heces fecales o en el papel higinico.  Si se someti a una preparacin intestinal para su procedimiento, es posible que no tenga una evacuacin intestinal normal durante [employer].   Tenga en cuenta:  Es posible que note un poco de irritacin y congestin en la nariz o algn drenaje.  Esto es debido al oxgeno [employer] durante su procedimiento.  No hay que preocuparse y esto debe desaparecer  ms o Regulatory affairs officer.   SNTOMAS PARA REPORTAR INMEDIATAMENTE:  Despus de una endoscopia inferior (colonoscopia):  Cantidades excesivas de sangre en las heces fecales  Sensibilidad significativa o empeoramiento de los dolores abdominales   Hinchazn aguda del abdomen que antes no tena   Fiebre de 100F o ms Despus de la endoscopia superior (EGD)  Vmitos de Retail buyer o material como caf molido   Dolor en el pecho o dolor debajo de los omplatos que antes no tena   Dolor o dificultad persistente para tragar  Falta de aire que antes no tena   Heces fecales negras y pegajosas   Para asuntos urgentes o de Associate Professor, puede comunicarse con un gastroenterlogo a cualquier hora llamando al [phone].  DIETA:  Recomendamos una comida pequea al principio, pero luego puede continuar con su dieta normal.  Tome muchos lquidos, Tax adviser las bebidas alcohlicas durante 24 horas.    ACTIVIDAD:  Debe planear tomarse las cosas con calma por el resto del da y no debe CONDUCIR ni usar maquinaria pesada Patent examiner (debido a los medicamentos de sedacin utilizados durante el examen).     SEGUIMIENTO: Nuestro personal llamar al nmero que aparece en su historial al siguiente da hbil de su procedimiento para ver cmo se siente y para responder cualquier pregunta o inquietud que pueda tener con respecto a la informacin que se le dio despus del procedimiento. Si no podemos contactarle, le dejaremos un mensaje.  Sin embargo, si se siente bien y no tiene ningn  problema, no es necesario que nos devuelva la llamada.  Asumiremos que ha regresado a sus actividades diarias normales sin incidentes. Si se le tomaron algunas biopsias, le contactaremos por telfono o por carta en las prximas 3 semanas.  Si no ha sabido [employer] biopsias en el transcurso de 3 semanas, por favor llmenos al [phone].   FIRMAS/CONFIDENCIALIDAD: Usted y/o el acompaante que le cuide han firmado documentos  que se ingresarn en su historial mdico electrnico.  Estas firmas atestiguan el hecho de que la informacin anterior

## 2023-02-13 LAB — CBC
HCT: 40.5 % (ref 38.5–50.0)
Hemoglobin: 13.3 g/dL (ref 13.2–17.1)
MCH: 27.7 pg (ref 27.0–33.0)
MCV: 84.2 fL (ref 80.0–100.0)
Platelets: 325 10*3/uL (ref 140–400)
RDW: 13.5 % (ref 11.0–15.0)
WBC: 6.1 10*3/uL (ref 3.8–10.8)

## 2023-02-13 LAB — IRON,TIBC AND FERRITIN PANEL
%SAT: 25 % (calc) (ref 20–48)
Ferritin: 10 ng/mL — ABNORMAL LOW (ref 38–380)
Iron: 104 ug/dL (ref 50–180)
TIBC: 422 mcg/dL (calc) (ref 250–425)

## 2023-02-15 ENCOUNTER — Telehealth: Payer: Self-pay

## 2023-02-15 NOTE — Telephone Encounter (Signed)
  Follow up Call-     02/12/2023    2:50 PM  Call back number  Post procedure Call Back phone  # [phone]  Permission to leave phone message No     Patient questions:  Do you have a fever, pain , or abdominal swelling? No. Pain Score  0 *  Have you tolerated food without any problems? Yes.    Have you been able to return to your normal activities? Yes.    Do you have any questions about your discharge instructions: Diet   No. Medications  No. Follow up visit  No.  Do you have questions or concerns about your Care? No.  Actions: * If pain score is 4 or above: No action needed, pain <4.

## 2023-02-16 ENCOUNTER — Encounter: Payer: Self-pay | Admitting: Gastroenterology

## 2023-02-16 ENCOUNTER — Ambulatory Visit: Payer: 59 | Admitting: Gastroenterology

## 2023-02-16 VITALS — BP 140/88 | HR 80 | Ht 65.0 in | Wt 215.0 lb

## 2023-02-16 DIAGNOSIS — D509 Iron deficiency anemia, unspecified: Secondary | ICD-10-CM | POA: Diagnosis not present

## 2023-02-16 DIAGNOSIS — K760 Fatty (change of) liver, not elsewhere classified: Secondary | ICD-10-CM | POA: Diagnosis not present

## 2023-02-16 DIAGNOSIS — K219 Gastro-esophageal reflux disease without esophagitis: Secondary | ICD-10-CM

## 2023-02-16 DIAGNOSIS — K648 Other hemorrhoids: Secondary | ICD-10-CM

## 2023-02-16 MED ORDER — HYDROCORTISONE ACETATE 25 MG RE SUPP: 25.0000 mg | Freq: Every day | RECTAL | 1 refills | Status: DC

## 2023-02-16 NOTE — Patient Instructions (Signed)
_______________________________________________________  If your blood pressure at your visit was 140/90 or greater, please contact your primary care physician to follow up on this.  _______________________________________________________  If you are age 49 or older, your body mass index should be between 23-30. Your Body mass index is 35.78 kg/m. If this is out of the aforementioned range listed, please consider follow up with your Primary Care Provider.  If you are age 46 or younger, your body mass index should be between 19-25. Your Body mass index is 35.78 kg/m. If this is out of the aformentioned range listed, please consider follow up with your Primary Care Provider.   Please return for labs in three months.   We have sent the following medications to your pharmacy for you to pick up at your convenience: Anusol suppositories once daily for no more than two weeks   The [facility] providers would like to encourage you to use [facility] to communicate with providers for non-urgent requests or questions.  Due to long hold times on the telephone, sending your provider a message by [facility] may be a faster and more efficient way to get a response.  Please allow 48 business hours for a response.  Please remember that this is for non-urgent requests.   It was a pleasure to see you today!  Thank you for trusting me with your gastrointestinal care!    Scott E.Tomasa Rand , MD

## 2023-02-16 NOTE — Progress Notes (Unsigned)
HPI : Zachary Kerr is a very pleasant 47 year old male who I initially saw April 18 of this year for further evaluation of painless hematochezia and iron deficiency anemia.  He had been having profuse painless hematochezia for many months without other lower GI symptoms.  He also mentioned recurrent typical GERD symptoms.  He was noted to have elevated liver enzymes, ALT predominant since at least 2018. He underwent an upper and lower endoscopy May 17.  The EGD was unremarkable.  Biopsies were taken of the stomach and duodenum which were pending at the time of his visit today. Colonoscopy was unremarkable except for large internal hemorrhoids.  A right upper quadrant ultrasound revealed fatty changes of the liver.  Laboratory evaluation excluded viral, autoimmune and genetic/metabolic causes of his elevated liver enzymes/fatty liver.  Today, the patient reports ongoing painless hematochezia, which worsened following his colonoscopy.  He does also report symptoms of prolapsing hemorrhoids, which reduced spontaneously.  He denies pain or discomfort from his hemorrhoids.  No problems with burning/itching or seepage. He reports regular bowel movements and denies problems with hard stools or straining.  He does admit to sitting on the toilet for prolonged times for bowel movements.  His hemoglobin and iron panel were rechecked after his procedures and were notable for a mild improvement in his hemoglobin, but persistent low ferritin.  His GERD symptoms have been minimal recently  He stopped taking his oral iron supplement a couple months ago.    EGD Feb 12, 2023 - Normal esophagus.  - Normal stomach. Biopsied.  - Normal examined duodenum.  - No endoscopic abnormalities to explain iron deficiency  Colonoscopy Feb 12, 2023 - The entire examined colon is normal.  - Non-bleeding internal hemorrhoids.  - No specimens collected.  RUQUS IMPRESSION: 1. Increased hepatic parenchymal echogenicity  suggestive of steatosis. 2. No cholelithiasis or sonographic evidence for acute cholecystitis.  Component Ref Range & Units 6 d ago (02/12/23) 3 mo ago (11/18/22) 3 mo ago (11/18/22)  Iron 50 - 180 mcg/dL 161 44 R   TIBC 096 - 045 mcg/dL (calc) 409    %SAT 20 - 48 % (calc) 25    Ferritin 38 - 380 ng/mL 10 Low   8.6 Low  R   Component Ref Range & Units 6 d ago 3 mo ago 1 yr ago 3 yr ago 4 yr ago 6 yr ago 8 yr ago  WBC 3.8 - 10.8 Thousand/uL 6.1 5.9 R 5.6 R 4.9 R 6.2 R 5.2 R 4.9 R  RBC 4.20 - 5.80 Million/uL 4.81 4.22 R 5.06 R 4.84 R 4.98 R 5.32 R 5.69 R  Hemoglobin 13.2 - 17.1 g/dL 81.1 91.4 Low  R 78.2 R 15.1 R 15.5 R 16.3 R 17.3 High  R  HCT 38.5 - 50.0 % 40.5 35.7 Low  R 44.7 R 44.4 R 45.0 R 47.4 R 48.9 R  MCV 80.0 - 100.0 fL 84.2 84.6 R 88.4 R 91.7 R 90.5 R 89.0 R 86.0 R  MCH 27.0 - 33.0 pg 27.7        MCHC 32.0 - 36.0 g/dL 95.6 21.3 R 08.6 R 57.8 R 34.4 R 34.5 R 35.4 R  RDW 11.0 - 15.0 % 13.5 13.2 R 13.0 R 14.0 R 12.9 R 12.9 R 13.2 R  Platelets 140 - 400 Thousand/uL 325 386.0 R 338.0 R 292.0 R 320.0 R 308.0 R 325.0 R    Past Medical History:  Diagnosis Date   Arthritis of left acromioclavicular joint  Environmental allergies    GERD (gastroesophageal reflux disease)    Hemorrhoids    History of 2019 novel coronavirus disease (COVID-19) 09/2019  and 01/ 2022   per pt both times mild symptoms that resolved,  pt does not have results for 01/ 2022   Left shoulder pain    OSA on CPAP    followed by dr Frances Furbish--- studay in epic 03-06-2019, moderate osa,, pt stated uses 5 days per week     Past Surgical History:  Procedure Laterality Date   ROTATOR CUFF REPAIR Right 2010   SHOULDER ARTHROSCOPY Left 12/2020   Family History  Problem Relation Age of Onset   Alcohol abuse Father        GF   Benign prostatic hyperplasia Other        auncle    Diabetes Neg Hx    Colon cancer Neg Hx    Esophageal cancer Neg Hx    Liver disease Neg Hx    Social History    Tobacco Use   Smoking status: Never   Smokeless tobacco: Never  Vaping Use   Vaping Use: Never used  Substance Use Topics   Alcohol use: Not Currently    Comment: socially    Drug use: Never   Current Outpatient Medications  Medication Sig Dispense Refill   Ferrous Fumarate (HEMOCYTE - 106 MG FE) 324 (106 Fe) MG TABS tablet Take 1 tablet (106 mg of iron total) by mouth daily. 90 tablet 1   omeprazole (PRILOSEC) 20 MG capsule Take 1 capsule (20 mg total) by mouth 2 (two) times daily before a meal. 168 capsule 1   hydrocortisone (ANUSOL-HC) 25 MG suppository Place 1 suppository (25 mg total) rectally daily. 12 suppository 1   No current facility-administered medications for this visit.   Allergies  Allergen Reactions   Other     Belt buckles, watch band and necklaces break with a rash, nickel?     Review of Systems: All systems reviewed and negative except where noted in HPI.    US Abdomen Limited RUQ (LIVER/GB)  Result Date: 01/22/2023 CLINICAL DATA:  Elevated LFTs EXAM: ULTRASOUND ABDOMEN LIMITED RIGHT UPPER QUADRANT COMPARISON:  None Available. FINDINGS: Gallbladder: No gallstones or wall thickening visualized. No sonographic Murphy sign noted by sonographer. Common bile duct: Diameter: 3.9 mL Liver: Increased echogenicity. No focal lesion. Portal vein is patent on color Doppler imaging with normal direction of blood flow towards the liver. Other: None. IMPRESSION: 1. Increased hepatic parenchymal echogenicity suggestive of steatosis. 2. No cholelithiasis or sonographic evidence for acute cholecystitis. Electronically Signed   By: Annia Belt M.D.   On: 01/22/2023 12:29    Physical Exam: BP (!) 140/88   Pulse 80   Ht 5\' 5"  (1.651 m)   Wt 215 lb (97.5 kg)   BMI 35.78 kg/m  Constitutional: Pleasant,well-developed, Hispanic male in no acute distress. HEENT: Normocephalic and atraumatic. Conjunctivae are normal. No scleral icterus. Neck supple.  Cardiovascular: Normal  rate, regular rhythm.  Pulmonary/chest: Effort normal and breath sounds normal. No wheezing, rales or rhonchi. Abdominal: Soft, nondistended, nontender. Bowel sounds active throughout. There are no masses palpable. No hepatomegaly. Extremities: no edema Neurological: Alert and oriented to person place and time. Skin: Skin is warm and dry. No rashes noted. Psychiatric: Normal mood and affect. Behavior is normal.  CBC    Component Value Date/Time   WBC 6.1 02/12/2023 1700   RBC 4.81 02/12/2023 1700   HGB 13.3 02/12/2023 1700   HCT 40.5  02/12/2023 1700   PLT 325 02/12/2023 1700   MCV 84.2 02/12/2023 1700   MCH 27.7 02/12/2023 1700   MCHC 32.8 02/12/2023 1700   RDW 13.5 02/12/2023 1700   LYMPHSABS 2.7 11/18/2022 1146   MONOABS 0.6 11/18/2022 1146   EOSABS 0.2 11/18/2022 1146   BASOSABS 0.0 11/18/2022 1146    CMP     Component Value Date/Time   NA 138 01/14/2023 1143   K 3.9 01/14/2023 1143   CL 106 01/14/2023 1143   CO2 26 01/14/2023 1143   GLUCOSE 100 (H) 01/14/2023 1143   BUN 13 01/14/2023 1143   CREATININE 1.03 01/14/2023 1143   CALCIUM 9.3 01/14/2023 1143   PROT 7.0 11/18/2022 1146   ALBUMIN 4.2 11/18/2022 1146   AST 23 11/18/2022 1146   ALT 52 11/18/2022 1146   ALKPHOS 67 11/18/2022 1146   BILITOT 0.5 11/18/2022 1146   GFRNONAA 101.13 09/18/2010 0952       Latest Ref Rng & Units 02/12/2023    5:00 PM 11/18/2022   11:46 AM 10/14/2021   11:14 AM  CBC EXTENDED  WBC 3.8 - 10.8 Thousand/uL 6.1  5.9  5.6   RBC 4.20 - 5.80 Million/uL 4.81  4.22  5.06   Hemoglobin 13.2 - 17.1 g/dL 16.1  09.6  04.5   HCT 38.5 - 50.0 % 40.5  35.7  44.7   Platelets 140 - 400 Thousand/uL 325  386.0  338.0   NEUT# 1.4 - 7.7 K/uL  2.3  2.6   Lymph# 0.7 - 4.0 K/uL  2.7  2.2     Fibrosis 4 Score = .48 (Low risk)        Interpretation for patients with NAFLD          <1.30       -  F0-F1 (Low risk)          1.30-2.67 -  Indeterminate           >2.67      -  F3-F4 (High risk)      Validated for ages 22-65     ASSESSMENT AND PLAN:  47 year old male with mild iron deficiency anemia, frequent, heavy hemorrhoidal bleeding, with upper and lower endoscopy negative for other sources of iron deficiency (biopsies pending at time of his visit), also with fatty liver disease. Although atypical, it is possible that his iron deficiency anemia may be secondary to profuse hemorrhoidal bleeding.  Gastric and duodenal biopsies still pending to evaluate for H. pylori and celiac.  I recommended he resume his oral iron given his persistent low ferritin and we will plan to repeat a CBC and iron panel in 3 months.  If his pending biopsies are unremarkable, and his hemorrhoidal bleeding has subsided and he continues to be anemic, then we will consider capsule endoscopy. For his hemorrhoids, I recommended he start taking Metamucil on a daily basis and to improve his toilet hygiene.  Also recommended trying Anusol suppositories for periods like now, when his hemorrhoids are inflamed and bleeding excessively.  We discussed hemorrhoid banding, but the patient was not interested.  His GERD symptoms are currently minimal.  He states he is not taking omeprazole.  No further evaluation recommended.  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  We discussed the importance of diet and exercise reversing/slowing progression of MASLD.  We discussed the lack of effective medications to treat MASLD.  We discussed the role of specific foods, namely  high sugar content foods/beverages and worsening MASLD.  We also discussed the positive effect of coffee and fatty liver, and I recommended he liberalize his consumption of black coffee.  His fib 4 score was low, no need for elastography. I recommended we repeat an ultrasound in 3 years and repeat liver enzymes in 1 year.   Iron deficiency anemia, likely secondary to hemorrhoidal bleeding -  Resume oral iron once daily - Repeat CBC, iron panel in 3 months  Hemorrhoids - Metamucil - Anusol  GERD - Controlled currently without meds  Fatty liver - Discussed diet/exercise/weight loss - Repeat US in 3 years - Repeat CMP in 1 year  Elesha Thedford E. Tomasa Rand, MD [facility]  I spent a total of 39 minutes reviewing the patient's medical record, interviewing and examining the patient, discussing her diagnosis and management of his condition going forward, and documenting in the medical record    CC:  Wanda Plump, MD

## 2023-02-18 NOTE — Progress Notes (Signed)
Zachary Kerr,  The biopsies from the recent upper GI Endoscopy were notable for H. Pylori gastritis.  This can be a cause of iron deficiency anemia.  We will plan on treating with quad therapy as below. Please confirm no medication allergies to the prescribed regimen.   1) Omeprazole 20 mg 2 times a day x 14 d 2) Pepto Bismol 2 tabs (262 mg each) 4 times a day x 14 d 3) Metronidazole 250 mg 4 times a day x 14 d 4) doxycycline 100 mg 2 times a day x 14 d  After 14 days, ok to stop omeprazole.  4 weeks after treatment completed, check H. Pylori stool antigen to confirm eradication (must be off acid suppression therapy for 2 weeks prior to specimen submission)  Dx: Zachary Kerr Pylori gastritis  Bonita Quin,  Can you please relay the above message and the place the orders for the meds and stool antigen?

## 2023-02-19 ENCOUNTER — Other Ambulatory Visit: Payer: Self-pay

## 2023-02-19 DIAGNOSIS — K2970 Gastritis, unspecified, without bleeding: Secondary | ICD-10-CM

## 2023-02-19 MED ORDER — OMEPRAZOLE 20 MG PO CPDR: 20.0000 mg | DELAYED_RELEASE_CAPSULE | Freq: Two times a day (BID) | ORAL | 0 refills | Status: DC

## 2023-02-19 MED ORDER — DOXYCYCLINE HYCLATE 100 MG PO CAPS: 100.0000 mg | ORAL_CAPSULE | Freq: Two times a day (BID) | ORAL | 0 refills | Status: DC

## 2023-02-19 MED ORDER — BISMUTH 262 MG PO CHEW: 524.0000 mg | CHEWABLE_TABLET | Freq: Four times a day (QID) | ORAL | 0 refills | Status: DC

## 2023-02-19 MED ORDER — METRONIDAZOLE 250 MG PO TABS: 250.0000 mg | ORAL_TABLET | Freq: Four times a day (QID) | ORAL | 0 refills | Status: DC

## 2023-03-12 ENCOUNTER — Ambulatory Visit (INDEPENDENT_AMBULATORY_CARE_PROVIDER_SITE_OTHER): Payer: 59 | Admitting: Internal Medicine

## 2023-03-12 ENCOUNTER — Encounter: Payer: Self-pay | Admitting: Internal Medicine

## 2023-03-12 VITALS — BP 122/74 | HR 83 | Temp 97.8°F | Resp 16 | Ht 65.0 in | Wt 212.4 lb

## 2023-03-12 DIAGNOSIS — R739 Hyperglycemia, unspecified: Secondary | ICD-10-CM | POA: Diagnosis not present

## 2023-03-12 DIAGNOSIS — Z0000 Encounter for general adult medical examination without abnormal findings: Secondary | ICD-10-CM

## 2023-03-12 LAB — LIPID PANEL
Cholesterol: 175 mg/dL (ref 0–200)
HDL: 47.6 mg/dL (ref >39.00)
LDL Cholesterol: 90 mg/dL (ref 0–99)
NonHDL: 127.33
Total CHOL/HDL Ratio: 4
Triglycerides: 186 mg/dL — ABNORMAL HIGH (ref 0.0–149.0)
VLDL: 37.2 mg/dL (ref 0.0–40.0)

## 2023-03-12 LAB — HEMOGLOBIN A1C: Hgb A1c MFr Bld: 5.9 % (ref 4.6–6.5)

## 2023-03-12 LAB — PSA: PSA: 1.11 ng/mL (ref 0.10–4.00)

## 2023-03-12 NOTE — Patient Instructions (Addendum)
Vaccine I recommend: Covid booster Flu shot this fall  GO TO THE LAB : Get the blood work     GO TO THE FRONT DESK, PLEASE SCHEDULE YOUR APPOINTMENTS Come back for a physical exam in 1 year

## 2023-03-12 NOTE — Progress Notes (Unsigned)
Subjective:    Patient ID: Zachary Kerr, male    DOB: 1975/11/20, 47 y.o.   MRN: [id number]  DOS:  03/12/2023 Type of visit - description: cpx  Here for CPX Since LOV saw GI, had endoscopies, reviewed. Currently doing well, hemorrhoids not giving him any trouble for the last 2 weeks.   Review of Systems See above   Past Medical History:  Diagnosis Date   Arthritis of left acromioclavicular joint    Environmental allergies    GERD (gastroesophageal reflux disease)    Hemorrhoids    History of 2019 novel coronavirus disease (COVID-19) 09/2019  and 01/ 2022   per pt both times mild symptoms that resolved,  pt does not have results for 01/ 2022   Left shoulder pain    OSA on CPAP    followed by dr Frances Furbish--- studay in epic 03-06-2019, moderate osa,, pt stated uses 5 days per week    Past Surgical History:  Procedure Laterality Date   ROTATOR CUFF REPAIR Right 2010   SHOULDER ARTHROSCOPY Left 12/2020    Current Outpatient Medications  Medication Instructions   Bismuth 524 mg, Oral, 4 times daily   doxycycline (VIBRAMYCIN) 100 mg, Oral, 2 times daily   Ferrous Fumarate (HEMOCYTE - 106 MG FE) 324 (106 Fe) MG TABS tablet 106 mg of iron, Oral, Daily   hydrocortisone (ANUSOL-HC) 25 mg, Rectal, Daily   metroNIDAZOLE (FLAGYL) 250 mg, Oral, 4 times daily   omeprazole (PRILOSEC) 20 mg, Oral, 2 times daily before meals       Objective:   Physical Exam BP 122/74   Pulse 83   Temp 97.8 F (36.6 C) (Oral)   Resp 16   Ht 5\' 5"  (1.651 m)   Wt 212 lb 6 oz (96.3 kg)   SpO2 97%   BMI 35.34 kg/m  General: Well developed, NAD, BMI noted Neck: No  thyromegaly  HEENT:  Normocephalic . Face symmetric, atraumatic Lungs:  CTA B Normal respiratory effort, no intercostal retractions, no accessory muscle use. Heart: RRR,  no murmur.  Abdomen:  Not distended, soft, non-tender. No rebound or rigidity.   Lower extremities: no pretibial edema bilaterally  Skin: Exposed areas without  rash. Not pale. Not jaundice Neurologic:  alert & oriented X3.  Speech normal, gait appropriate for age and unassisted Strength symmetric and appropriate for age.  Psych: Cognition and judgment appear intact.  Cooperative with normal attention span and concentration.  Behavior appropriate. No anxious or depressed appearing.     Assessment      ASSESSMENT GERD OSA DX 2020 Internal hemorrhoids.  Large.  Colonoscopy 01/2023 Increased LFTs, denies Tylenol or EtOH - Hep B, hep C serology negative (2020) - Saw GI 2024: Ultrasound-hepatic steatosis, multiple labs negative. Helicobacter pylori 01-2023 per EGD.   PLAN Here for CPX  - Td 2022 - Vaccines I recommend: Covid vax and flu shot  (fall) -CCS: IDA, C-scope 01/2023, next 10 years.    - Prostate cancer screening: Soon-to-be 47 years old, no symptoms, check a PSA. -Diet and exercise: Recommend to lower starches, increase fruits vegetables.  Remain active. -Labs reviewed, will check FLP A1c PSA.  OSA: Good CPAP compliance.  IDA, increased LFTs: Saw GI, liver ultrasound: Steatosis.  Multiple labs done for increased LFTs and they were all okay.  Immune to hep AMB due to previous vaccines.  Was recommend diet and exercise for steatosis. Had a EGD and a colonoscopy, + Helicobacter.  Finishing treatment. Internal hemorrhoids: Minimal symptom  on for the last 2 weeks.  Next steps per GI. RTC 1 year.    Internal hemorrhoids:  Refer to GI 2021, referral failed   Symptoms incrTd 2022 -Covid vax: d/w pt  -CCS: No family history of colon cancer.    3 options discussed, elected IFOB  - Prostate cancer screening: Not indicated -Diet and exercise  d/w pt  -Labs LFTs, FLP, A1c, ifobeased for the last 6 months, having what he describes as severe bleeding.  Physical exam and anoscopy confirmed internal hemorrhoids. Needs further eval: GI referral.  Check CBC, iron, ferritin.  Anusol HC as needed. Increased LFTs: Noted last year, Rx liver  US, referral failed.  Will try again.  He does not drink etoh , no  Tylenol. Prediabetes: Last A1c 5.9. All instructions were clearly discussed in Spanish. RTC in few weeks for CPX

## 2023-03-13 ENCOUNTER — Encounter: Payer: Self-pay | Admitting: Internal Medicine

## 2023-03-13 NOTE — Assessment & Plan Note (Signed)
Here for CPX OSA: Good CPAP compliance. IDA, increased LFTs: Saw GI, liver ultrasound: Steatosis.  Multiple labs done for increased LFTs and they were all okay.  Immune to hep A&B due to previous vaccines.  Was recommend diet and exercise for steatosis. Had a EGD and a colonoscopy, + Helicobacter.  Finishing treatment. Internal hemorrhoids: Minimal symptom on for the last 2 weeks.  Next steps per GI. RTC 1 year.

## 2023-03-13 NOTE — Assessment & Plan Note (Signed)
-   Td 2022 - Vaccines I recommend: Covid vax and flu shot  (fall) -CCS: h/o  IDA, C-scope 01/2023, next 10 years.    - Prostate cancer screening: Soon-to-be 47 years old, no symptoms, check a PSA. -Diet and exercise: Recommend to lower starches intake, increase fruits vegetables.  Remain active. -Labs reviewed, will check FLP A1c PSA.

## 2023-04-02 ENCOUNTER — Telehealth: Payer: Self-pay | Admitting: *Deleted

## 2023-04-02 NOTE — Telephone Encounter (Signed)
-----   Message from Missy Sabins, RN sent at 04/02/2023  8:23 AM EDT ----- Regarding: FW: Stool test  ----- Message ----- From: Chrystie Nose, RN Sent: 04/02/2023  12:00 AM EDT To: Chrystie Nose, RN Subject: Stool test                                     Pt needs to be off PPI for 2 weeks prior to submitting stool

## 2023-04-02 NOTE — Telephone Encounter (Signed)
Patient called to notify that he needs to come to the lab to pick up stool kit to test for the eradication of H. Pylori infection. Informed of hours of operation and location of [place] building. Also informed he needs to be off the Prilosec for 2 weeks prior to starting the stool test. Patient agreed.

## 2023-04-06 ENCOUNTER — Other Ambulatory Visit: Payer: 59

## 2023-04-20 ENCOUNTER — Other Ambulatory Visit: Payer: 59

## 2023-04-20 DIAGNOSIS — B9681 Helicobacter pylori [H. pylori] as the cause of diseases classified elsewhere: Secondary | ICD-10-CM

## 2023-04-22 LAB — H. PYLORI ANTIGEN, STOOL: H pylori Ag, Stl: NEGATIVE

## 2023-04-24 NOTE — Progress Notes (Signed)
Zachary Kerr,  Your H. Pylori stool test was negative, indicating that it has been eradicated with the antibiotics.  No further testing or treatment is needed.

## 2023-12-08 ENCOUNTER — Ambulatory Visit: Payer: Self-pay | Admitting: Internal Medicine

## 2023-12-08 NOTE — Telephone Encounter (Signed)
 Copied from CRM [id number]. Topic: Clinical - Red Word Triage >> Dec 08, 2023  3:09 PM Theodis Sato wrote: Red Word that prompted transfer to Nurse Triage: Chest pain and dizziness from excessive coughing and yellow mucus. Patient states he has also been throwing up.  Chief Complaint: Cough Symptoms: Vomiting, chest soreness Frequency: A few weeks Pertinent Negatives: Patient denies fever Disposition: [] ED /[] Urgent Care (no appt availability in office) / [x] Appointment(In office/virtual)/ []  [place] [facility]/ [] Home Care/ [] Refused Recommended Disposition /[] [place] [facility]/ []  Follow-up with PCP Additional Notes: Patient called in to report a productive cough that has been present for a few weeks. Patient stated the cough causes coughing spells. Patient stated the coughing spells can cause vomiting at times. Patient stated she experiences SOB during coughing spells. Patient denied SOB at other times. Patient stated he is experiencing chest soreness from coughing. This RN advised patient to be seen within 24 hours, per protocol. No availability with PCP within timeframe. This RN scheduled patient with alternate provider in office tomorrow morning. This RN advised patient to call back if symptoms worsen. Patient complied.   Reason for Disposition  SEVERE coughing spells (e.g., whooping sound after coughing, vomiting after coughing)  Answer Assessment - Initial Assessment Questions 1. ONSET: "When did the cough begin?"      Cough has been present for a few weeks 2. SEVERITY: "How bad is the cough today?"      Coughing spells, especially at night, states coughing spells cause him vomit 3. SPUTUM: "Describe the color of your sputum" (none, dry cough; clear, white, yellow, green)     Unknown 4. HEMOPTYSIS: "Are you coughing up any blood?" If so ask: "How much?" (flecks, streaks, tablespoons, etc.)     Denies 5. DIFFICULTY BREATHING: "Are you having difficulty breathing?" If Yes, ask:  "How bad is it?" (e.g., mild, moderate, severe)    - MILD: No SOB at rest, mild SOB with walking, speaks normally in sentences, can lie down, no retractions, pulse < 100.    - MODERATE: SOB at rest, SOB with minimal exertion and prefers to sit, cannot lie down flat, speaks in phrases, mild retractions, audible wheezing, pulse 100-120.    - SEVERE: Very SOB at rest, speaks in single words, struggling to breathe, sitting hunched forward, retractions, pulse > 120      SOB during coughing spells 6. FEVER: "Do you have a fever?" If Yes, ask: "What is your temperature, how was it measured, and when did it start?"     Denies 7. CARDIAC HISTORY: "Do you have any history of heart disease?" (e.g., heart attack, congestive heart failure)      Denies 8. LUNG HISTORY: "Do you have any history of lung disease?"  (e.g., pulmonary embolus, asthma, emphysema)     Denies 10. OTHER SYMPTOMS: "Do you have any other symptoms?" (e.g., runny nose, wheezing, chest pain)     Sore chest from coughing, cough causes vomiting at times, congestion and runny nose have resolved  Protocols used: Cough - Acute Productive-A-AH

## 2023-12-09 ENCOUNTER — Ambulatory Visit: Admitting: Family Medicine

## 2023-12-09 ENCOUNTER — Encounter: Payer: Self-pay | Admitting: Family Medicine

## 2023-12-09 VITALS — BP 110/70 | HR 93 | Temp 98.7°F | Resp 18 | Ht 65.0 in | Wt 216.0 lb

## 2023-12-09 DIAGNOSIS — K921 Melena: Secondary | ICD-10-CM | POA: Insufficient documentation

## 2023-12-09 DIAGNOSIS — K219 Gastro-esophageal reflux disease without esophagitis: Secondary | ICD-10-CM

## 2023-12-09 DIAGNOSIS — K649 Unspecified hemorrhoids: Secondary | ICD-10-CM | POA: Insufficient documentation

## 2023-12-09 DIAGNOSIS — R052 Subacute cough: Secondary | ICD-10-CM | POA: Diagnosis not present

## 2023-12-09 LAB — CBC WITH DIFFERENTIAL/PLATELET
Basophils Absolute: 0.1 10*3/uL (ref 0.0–0.1)
Basophils Relative: 1.1 % (ref 0.0–3.0)
Eosinophils Absolute: 0.3 10*3/uL (ref 0.0–0.7)
Eosinophils Relative: 5.3 % — ABNORMAL HIGH (ref 0.0–5.0)
HCT: 29.8 % — ABNORMAL LOW (ref 39.0–52.0)
Hemoglobin: 9 g/dL — ABNORMAL LOW (ref 13.0–17.0)
Lymphocytes Relative: 40.3 % (ref 12.0–46.0)
Lymphs Abs: 2 10*3/uL (ref 0.7–4.0)
MCHC: 30.1 g/dL (ref 30.0–36.0)
MCV: 65.9 fl — ABNORMAL LOW (ref 78.0–100.0)
Monocytes Absolute: 0.4 10*3/uL (ref 0.1–1.0)
Monocytes Relative: 8.6 % (ref 3.0–12.0)
Neutro Abs: 2.3 10*3/uL (ref 1.4–7.7)
Neutrophils Relative %: 44.7 % (ref 43.0–77.0)
Platelets: 372 10*3/uL (ref 150.0–400.0)
RBC: 4.52 Mil/uL (ref 4.22–5.81)
RDW: 18.5 % — ABNORMAL HIGH (ref 11.5–15.5)
WBC: 5.1 10*3/uL (ref 4.0–10.5)

## 2023-12-09 LAB — COMPREHENSIVE METABOLIC PANEL
ALT: 41 U/L (ref 0–53)
AST: 26 U/L (ref 0–37)
Albumin: 4.8 g/dL (ref 3.5–5.2)
Alkaline Phosphatase: 77 U/L (ref 39–117)
BUN: 12 mg/dL (ref 6–23)
CO2: 27 meq/L (ref 19–32)
Calcium: 9.8 mg/dL (ref 8.4–10.5)
Chloride: 104 meq/L (ref 96–112)
Creatinine, Ser: 1.05 mg/dL (ref 0.40–1.50)
GFR: 83.15 mL/min (ref >60.00)
Glucose, Bld: 93 mg/dL (ref 70–99)
Potassium: 4.5 meq/L (ref 3.5–5.1)
Sodium: 138 meq/L (ref 135–145)
Total Bilirubin: 0.4 mg/dL (ref 0.2–1.2)
Total Protein: 7.9 g/dL (ref 6.0–8.3)

## 2023-12-09 LAB — IRON,TIBC AND FERRITIN PANEL
%SAT: 3 % — ABNORMAL LOW (ref 20–48)
Ferritin: 5 ng/mL — ABNORMAL LOW (ref 38–380)
Iron: 14 ug/dL — ABNORMAL LOW (ref 50–180)
TIBC: 514 ug/dL — ABNORMAL HIGH (ref 250–425)

## 2023-12-09 MED ORDER — AZITHROMYCIN 250 MG PO TABS: ORAL_TABLET | ORAL | 0 refills | Status: DC

## 2023-12-09 MED ORDER — OMEPRAZOLE 20 MG PO CPDR
20.0000 mg | DELAYED_RELEASE_CAPSULE | Freq: Two times a day (BID) | ORAL | 3 refills | Status: AC
Start: 2023-12-09 — End: ?

## 2023-12-09 MED ORDER — HYDROCORTISONE ACETATE 25 MG RE SUPP
25.0000 mg | Freq: Two times a day (BID) | RECTAL | 0 refills | Status: DC
Start: 2023-12-09 — End: 2024-02-28

## 2023-12-09 MED ORDER — PROMETHAZINE-DM 6.25-15 MG/5ML PO SYRP: 5.0000 mL | ORAL_SOLUTION | Freq: Four times a day (QID) | ORAL | 0 refills | Status: DC | PRN

## 2023-12-09 NOTE — Assessment & Plan Note (Signed)
 Hx int hemorrhoids ---- anusol supp

## 2023-12-09 NOTE — Patient Instructions (Signed)
 GERD in Adults: Diet Changes When you have gastroesophageal reflux disease (GERD), you may need to make changes to your diet. Choosing the right foods can help with your symptoms. Think about working with an expert in healthy eating called a dietitian. They can help you make healthy food choices. What are tips for following this plan? Reading food labels Look for foods that are low in saturated fat. Foods that may help with your symptoms include: Foods with less than 5% of daily value (DV) of fat. Foods with 0 grams of trans fat. Cooking [employer] in ways that don't use a lot of fat. These ways include: Baking. Steaming. Grilling. Broiling. To add flavor, try to use herbs that are low in spice and acidity. Avoid frying your food. Meal planning  Eat small meals often rather than eating 3 large meals each day. Eat your meals slowly in a place where you feel relaxed. If told by your health care provider, avoid: Foods that cause symptoms. Keep a food diary to keep track of foods that cause symptoms. Alcohol. Drinking a lot of liquid with meals. General instructions For 2-3 hours after you eat, avoid: Bending over. Exercise. Lying down. Chew sugar-free gum after meals. What foods should I eat? Eat a healthy diet. Try to include: Foods with high amounts of fiber. These include: Fruits and vegetables. Whole grains and beans. Low-fat dairy products. Lean meats, fish, and poultry. Egg whites. Foods that cause symptoms in someone else may not cause symptoms for you. Work with your provider to find foods that are safe for you. The items listed above may not be all the foods and drinks you can have. Talk with a dietitian to learn more. The items listed above may not be a complete list of foods and beverages you can eat and drink. Contact a dietitian for more information. What foods should I avoid? Limiting some of these foods may help with your symptoms. Each person is different.  Talk with a dietitian or your provider to help you find the exact foods to avoid. Some of the foods to avoid may include: Fruits Fruits with a lot of acid in them. These may include citrus fruits, such as oranges, grapefruit, pineapple, and lemons. Vegetables Deep-fried vegetables, such as [place] fries. Vegetables, sauces, or toppings made with added fat and vegetables with acid in them. These may include tomatoes and tomato products, chili peppers, onions, garlic, and horseradish. Grains Pastries or quick breads with added fat. Meats and other proteins High-fat meats, such as fatty beef or pork, hot dogs, ribs, ham, sausage, salami, and bacon. Fried meat or protein, such as fried fish and fried chicken. Egg yolks. Fats and oils Butter. Margarine. Shortening. Ghee. Drinks Coffee and other drinks with caffeine in them. Fizzy and sugary drinks, such as soda and energy drinks. Fruit juice made with acidic fruits, such as orange or grapefruit. Tomato juice. Sweets and desserts Chocolate and cocoa. Donuts. Seasonings and condiments Mint, such as peppermint and spearmint. Condiments, herbs, or seasonings that cause symptoms. These may include curry, hot sauce, or vinegar-based salad dressings. The items listed above may not be all the foods and drinks you should avoid. Talk with a dietitian to learn more. Questions to ask your health care provider Changes to your diet and everyday life are often the first steps taken to manage symptoms of GERD. If these changes don't help, talk with your provider about taking medicines. Where to find more information [employer]:  aboutgerd.org This information is not intended to replace advice given to you by your health care provider. Make sure you discuss any questions you have with your health care provider. Document Revised: 07/27/2023 Document Reviewed: 02/10/2023 Elsevier Patient Education  2024 [employer].

## 2023-12-09 NOTE — Progress Notes (Signed)
 Established Patient Office Visit  Subjective   Patient ID: Zachary Kerr, male    DOB: 11-Jun-1976  Age: 48 y.o. MRN: [id number]  Chief Complaint  Patient presents with   Cough    X1 month, pt states coughing so hard that causes vomiting and occ headache. Pt states he thinks he had the flu about 3 weeks ago.    HPI Discussed the use of AI scribe software for clinical note transcription with the patient, who gave verbal consent to proceed.  History of Present Illness   He presents with a persistent cough and gastrointestinal issues.  He has been experiencing a persistent cough for approximately one month. Initially, he thought it was due to the flu, as he had a fever, though he is unsure of the exact temperature. The cough has persisted despite the resolution of other flu-like symptoms. It is severe enough to induce vomiting, especially when he feels a tickle in his throat, which worsens when he is outdoors. The cough is more pronounced at night, disrupting his sleep, and occurs occasionally during the day. No fever, mucus production, or congestion is present. He has not taken any over-the-counter medications or conducted any home tests for COVID-19 or flu.  He also experiences gastrointestinal symptoms, including a sensation of heaviness and discomfort after eating, particularly when consuming cereal before bed. This discomfort sometimes leads to vomiting, which provides relief. He has been trying to avoid eating late in the evening to mitigate these symptoms. He is currently taking omeprazole as needed, which he finds helpful, and recalls being treated for H. pylori with a course of four medications a few months ago. He has undergone an endoscopy in the past due to a stricture and H. pylori infection.  He mentions a history of hemorrhoids, identified during a colonoscopy. He experiences bleeding with bowel movements, especially after consuming spicy foods. No significant straining during bowel  movements is reported, but certain foods exacerbate his symptoms. He avoids alcohol and smoking, and he is aware of dietary triggers that worsen his gastrointestinal symptoms, such as dairy and acidic foods.      Patient Active Problem List   Diagnosis Date Noted   Blood in stool 12/09/2023   Hemorrhoids 12/09/2023   Subacute cough 12/09/2023   OSA on CPAP 06/02/2019   PCP NOTES >>>>>>>>>>>>>>>>>> 11/30/2018   Allergic rhinitis 10/10/2013   Annual physical exam 04/18/2013   GERD 04/06/2008   Past Medical History:  Diagnosis Date   Arthritis of left acromioclavicular joint    Environmental allergies    GERD (gastroesophageal reflux disease)    Hemorrhoids    History of 2019 novel coronavirus disease (COVID-19) 09/2019  and 01/ 2022   per pt both times mild symptoms that resolved,  pt does not have results for 01/ 2022   Left shoulder pain    OSA on CPAP    followed by dr Frances Furbish--- studay in epic 03-06-2019, moderate osa,, pt stated uses 5 days per week   Past Surgical History:  Procedure Laterality Date   ROTATOR CUFF REPAIR Right 2010   SHOULDER ARTHROSCOPY Left 12/2020   Social History   Tobacco Use   Smoking status: Never   Smokeless tobacco: Never  Vaping Use   Vaping status: Never Used  Substance Use Topics   Alcohol use: Not Currently    Comment: socially    Drug use: Never   Social History   Socioeconomic History   Marital status: Married    Spouse name:  Not on file   Number of children: 3   Years of education: Not on file   Highest education level: Not on file  Occupational History   Occupation: Estate agent   Tobacco Use   Smoking status: Never   Smokeless tobacco: Never  Vaping Use   Vaping status: Never Used  Substance and Sexual Activity   Alcohol use: Not Currently    Comment: socially    Drug use: Never   Sexual activity: Not on file  Other Topics Concern   Not on file  Social History Narrative   [place]    Household: pt,  wife, 3 children    Social Drivers of Corporate investment banker Strain: Not on file  Food Insecurity: Not on file  Transportation Needs: Not on file  Physical Activity: Not on file  Stress: Not on file  Social Connections: Not on file  Intimate Partner Violence: Not on file   Family Status  Relation Name Status   Mother  Alive   Father  Deceased   Other  (Not Specified)   Neg Hx  (Not Specified)  No partnership data on file   Family History  Problem Relation Age of Onset   Alcohol abuse Father        GF   Benign prostatic hyperplasia Other        auncle    Diabetes Neg Hx    Colon cancer Neg Hx    Esophageal cancer Neg Hx    Liver disease Neg Hx    Prostate cancer Neg Hx    Allergies  Allergen Reactions   Other     Belt buckles, watch band and necklaces break with a rash, nickel?      Review of Systems  Constitutional:  Negative for chills, fever and malaise/fatigue.  HENT:  Negative for congestion and hearing loss.   Eyes:  Negative for blurred vision and discharge.  Respiratory:  Negative for cough, sputum production and shortness of breath.   Cardiovascular:  Negative for chest pain, palpitations and leg swelling.  Gastrointestinal:  Negative for abdominal pain, blood in stool, constipation, diarrhea, heartburn, nausea and vomiting.  Genitourinary:  Negative for dysuria, frequency, hematuria and urgency.  Musculoskeletal:  Negative for back pain, falls and myalgias.  Skin:  Negative for rash.  Neurological:  Negative for dizziness, sensory change, loss of consciousness, weakness and headaches.  Endo/Heme/Allergies:  Negative for environmental allergies. Does not bruise/bleed easily.  Psychiatric/Behavioral:  Negative for depression and suicidal ideas. The patient is not nervous/anxious and does not have insomnia.       Objective:     BP 110/70 (BP Location: Left Arm, Patient Position: Sitting, Cuff Size: Large)   Pulse 93   Temp 98.7 F (37.1 C)  (Oral)   Resp 18   Ht 5\' 5"  (1.651 m)   Wt 216 lb (98 kg)   SpO2 98%   BMI 35.94 kg/m  BP Readings from Last 3 Encounters:  12/09/23 110/70  03/12/23 122/74  02/16/23 (!) 140/88   Wt Readings from Last 3 Encounters:  12/09/23 216 lb (98 kg)  03/12/23 212 lb 6 oz (96.3 kg)  02/16/23 215 lb (97.5 kg)   SpO2 Readings from Last 3 Encounters:  12/09/23 98%  03/12/23 97%  02/12/23 95%      Physical Exam Vitals and nursing note reviewed.  Constitutional:      General: He is not in acute distress.    Appearance: Normal appearance. He  is well-developed.  HENT:     Head: Normocephalic and atraumatic.  Eyes:     General: No scleral icterus.       Right eye: No discharge.        Left eye: No discharge.  Cardiovascular:     Rate and Rhythm: Normal rate and regular rhythm.     Heart sounds: No murmur heard. Pulmonary:     Effort: Pulmonary effort is normal. No respiratory distress.     Breath sounds: Normal breath sounds.  Musculoskeletal:        General: Normal range of motion.     Cervical back: Normal range of motion and neck supple.     Right lower leg: No edema.     Left lower leg: No edema.  Skin:    General: Skin is warm and dry.  Neurological:     Mental Status: He is alert and oriented to person, place, and time.  Psychiatric:        Mood and Affect: Mood normal.        Behavior: Behavior normal.        Thought Content: Thought content normal.        Judgment: Judgment normal.      No results found for any visits on 12/09/23.  Last CBC Lab Results  Component Value Date   WBC 6.1 02/12/2023   HGB 13.3 02/12/2023   HCT 40.5 02/12/2023   MCV 84.2 02/12/2023   MCH 27.7 02/12/2023   RDW 13.5 02/12/2023   PLT 325 02/12/2023   Last metabolic panel Lab Results  Component Value Date   GLUCOSE 100 (H) 01/14/2023   NA 138 01/14/2023   K 3.9 01/14/2023   CL 106 01/14/2023   CO2 26 01/14/2023   BUN 13 01/14/2023   CREATININE 1.03 01/14/2023   GFR 85.64  01/14/2023   CALCIUM 9.3 01/14/2023   PROT 7.0 11/18/2022   ALBUMIN 4.2 11/18/2022   BILITOT 0.5 11/18/2022   ALKPHOS 67 11/18/2022   AST 23 11/18/2022   ALT 52 11/18/2022   Last lipids Lab Results  Component Value Date   CHOL 175 03/12/2023   HDL 47.60 03/12/2023   LDLCALC 90 03/12/2023   LDLDIRECT 120.0 12/25/2021   TRIG 186.0 (H) 03/12/2023   CHOLHDL 4 03/12/2023   Last hemoglobin A1c Lab Results  Component Value Date   HGBA1C 5.9 03/12/2023   Last thyroid functions Lab Results  Component Value Date   TSH 1.61 10/14/2021   Last vitamin D No results found for: "25OHVITD2", "25OHVITD3", "VD25OH" Last vitamin B12 and Folate No results found for: "VITAMINB12", "FOLATE"    The 10-year ASCVD risk score (Arnett DK, et al., 2019) is: 2.1%    Assessment & Plan:   Problem List Items Addressed This Visit       Unprioritized   Subacute cough   S/p h pylori--- f/u gI Z pak and cough med  Consider xray if no relief ? From gerd        Relevant Medications   azithromycin (ZITHROMAX Z-PAK) 250 MG tablet   promethazine-dextromethorphan (PROMETHAZINE-DM) 6.25-15 MG/5ML syrup   Hemorrhoids   Relevant Medications   hydrocortisone (ANUSOL-HC) 25 MG suppository   GERD - Primary   Take omeprazole bid  Ho on diet given to pt F/u GI  Maybe cause of cough?      Relevant Medications   omeprazole (PRILOSEC) 20 MG capsule   Other Relevant Orders   Ambulatory referral to Gastroenterology   Blood  in stool   Hx int hemorrhoids ---- anusol supp      Relevant Medications   hydrocortisone (ANUSOL-HC) 25 MG suppository   Other Relevant Orders   Ambulatory referral to Gastroenterology   CBC with Differential/Platelet   Comprehensive metabolic panel   Iron, TIBC and Ferritin Panel  Assessment and Plan    Chronic Cough   He has a persistent non-productive cough for one month, initially following a flu-like illness. The cough worsens at night with a tickling throat  sensation but is not accompanied by fever or other respiratory symptoms. It may be post-viral or allergic in origin and is severe enough to cause vomiting and disrupt sleep. Prescribe nighttime cough medicine and recommend daytime Mucinex to avoid drowsiness. Start azithromycin (Z-Pak) for five days due to the prolonged nature of the cough.  Gastroesophageal Reflux Disease (GERD)   He experiences GERD with heartburn and regurgitation, particularly after late-night eating. Symptoms improve with vomiting. He was previously treated for H. pylori and is currently on as-needed omeprazole. Advise taking omeprazole twice daily for one month. Discuss dietary triggers such as dairy, caffeine, peppermint, acidic foods, onions, peppers, and alcohol. He avoids late eating to prevent symptoms. Adopt dietary modifications to avoid exacerbating foods and access dietary changes information for reflux via MyChart.  Hemorrhoids   Large internal hemorrhoids were noted on a previous colonoscopy, with symptoms including occasional bleeding with bowel movements, worsened by spicy foods. There is no significant straining. Recommend a high fiber diet to improve bowel movements and reduce symptoms. Prescribe suppositories to shrink hemorrhoids and suggest a high fiber diet including Metamucil, Citrucel, or Benefiber. Avoid spicy foods that exacerbate symptoms.  Follow-up   He needs a follow-up with gastroenterologist Dr. Tomasa Rand for ongoing GERD and hemorrhoid management. Prolonged bleeding with bowel movements and dietary triggers require further evaluation. Arrange a follow-up appointment with Dr. Tomasa Rand.        Return if symptoms worsen or fail to improve.    Donato Schultz, DO

## 2023-12-09 NOTE — Assessment & Plan Note (Signed)
 Take omeprazole bid  Ho on diet given to pt F/u GI  Maybe cause of cough?

## 2023-12-09 NOTE — Assessment & Plan Note (Signed)
 S/p h pylori--- f/u gI Z pak and cough med  Consider xray if no relief ? From gerd

## 2023-12-20 ENCOUNTER — Telehealth: Payer: Self-pay | Admitting: Internal Medicine

## 2023-12-20 DIAGNOSIS — D649 Anemia, unspecified: Secondary | ICD-10-CM

## 2023-12-20 NOTE — Telephone Encounter (Signed)
 Arrange for a CBC.  Dx anemia. Patient will call and make a lab appointment. ==== Seen 12/09/2023: Har respiratory symptoms GI symptoms: Postprandial abdominal pain, occasional nausea.  On PPIs as needed.  + Bleeding with BMs. Hemoglobin decreased from 13.3, to 9.0.  Ferritin 5.0, low.  Was referred to GI. Spoke with the patient today, respiratory symptoms gone. Still have red blood per rectum with bowel movements, no melena.  Some anal pain. Taking iron inconsistently. Plan:  Recheck CBC, take iron consistently, take PPIs consistently. GI referral pending

## 2023-12-20 NOTE — Telephone Encounter (Signed)
 Order placed

## 2024-01-03 ENCOUNTER — Encounter: Payer: Self-pay | Admitting: Gastroenterology

## 2024-01-03 ENCOUNTER — Ambulatory Visit: Admitting: Gastroenterology

## 2024-01-03 VITALS — BP 130/80 | HR 81 | Ht 65.0 in | Wt 213.0 lb

## 2024-01-03 DIAGNOSIS — K648 Other hemorrhoids: Secondary | ICD-10-CM | POA: Diagnosis not present

## 2024-01-03 DIAGNOSIS — D509 Iron deficiency anemia, unspecified: Secondary | ICD-10-CM

## 2024-01-03 DIAGNOSIS — K219 Gastro-esophageal reflux disease without esophagitis: Secondary | ICD-10-CM | POA: Diagnosis not present

## 2024-01-03 DIAGNOSIS — G4730 Sleep apnea, unspecified: Secondary | ICD-10-CM | POA: Diagnosis not present

## 2024-01-03 DIAGNOSIS — A048 Other specified bacterial intestinal infections: Secondary | ICD-10-CM

## 2024-01-03 MED ORDER — FERROUS SULFATE 325 (65 FE) MG PO TABS: 325.0000 mg | ORAL_TABLET | Freq: Every day | ORAL | 0 refills | Status: AC

## 2024-01-03 NOTE — Patient Instructions (Signed)
 _______________________________________________________  If your blood pressure at your visit was 140/90 or greater, please contact your primary care physician to follow up on this.  _______________________________________________________  If you are age 50 or older, your body mass index should be between 23-30. Your Body mass index is 35.45 kg/m. If this is out of the aforementioned range listed, please consider follow up with your Primary Care Provider.  If you are age 29 or younger, your body mass index should be between 19-25. Your Body mass index is 35.45 kg/m. If this is out of the aformentioned range listed, please consider follow up with your Primary Care Provider.   ________________________________________________________  The [facility] providers would like to encourage you to use [facility] to communicate with providers for non-urgent requests or questions.  Due to long hold times on the telephone, sending your provider a message by [facility] may be a faster and more efficient way to get a response.  Please allow 48 business hours for a response.  Please remember that this is for non-urgent requests.  _______________________________________________________  Your provider has requested that you go to the basement level for lab work after April 03, 2024. Press "B" on the elevator. The lab is located at the first door on the left as you exit the elevator.  We have sent the following medications to your pharmacy for you to pick up at your convenience: Iron tablet daily  Your provider has ordered "Diathe[employer]" stool testing for you. You have received a kit from our office today containing all necessary supplies to complete this test. Please carefully read the stool collection instructions provided in the kit before opening the accompanying materials. In addition, be sure there is a label providing your full name and date of birth on the "puritan opti-swab" tube that is supplied in the kit (if you  do not see a label with this information on your test tube, please make [place] aware before test collection!). After completing the test, you should secure the purtian tube into the specimen biohazard bag. The [facility] E-Req sheet (including date and time of specimen collection) should be placed into the outside pocket of the specimen biohazard bag and returned to the [place] lab (basement floor of [facility]) within 3 days of collection. Please make sure to give the specimen to a staff member at the lab. DO NOT leave the specimen on the counter.   If the specimen date and time (can be found in the upper right boxed portion of the sheet) are not filled out on the E-Req sheet, the test will NOT be performed.   It was a pleasure to see you today!  Thank you for trusting me with your gastrointestinal care!

## 2024-01-03 NOTE — Progress Notes (Signed)
 Discussed the use of AI scribe software for clinical note transcription with the patient, who gave verbal consent to proceed.  HPI : Zachary Kerr is a 48 y.o. male with a history of GERD, iron deficiency anemia, H. pylori gastritis and symptomatic hemorrhoids who presents for follow-up.  He was last seen by me in May 2024 shortly after his upper and lower endoscopy.  His gastric biopsies were positive for H. pylori and he was treated with quadruple therapy with a subsequent negative H. pylori stool test in July 2024. He was recently seen by his PCP, and labs there showed that his hemoglobin had dropped to 9 from 13 a year ago, and his iron indices were consistent with iron deficiency anemia.  He has a history of hemorrhoids, which have previously caused bleeding, particularly after consuming spicy foods. Bleeding episodes could last 10 to 15 days, occurring multiple times a day, but he has not experienced significant bleeding in the past month due to dietary changes. During bleeding episodes, he might go to the bathroom six to seven times a day, sometimes passing only blood.  He has not been taking prescribed iron supplements consistently, only using over-the-counter options sporadically. He experiences fatigue, feeling tired even after sleeping six to seven hours, which he considers a lot for him. He also has a history of sleep apnea but has not been using his CPAP mask consistently, noting no difference in his fatigue levels whether he uses it or not.  He takes omeprazole for acid reflux, usually once or twice a day, and reports that his reflux is well-controlled as long as he avoids eating close to bedtime. He occasionally experiences discomfort if he eats late, such as a bowl of cereal before sleeping. Overall, his GERD symptoms are well controlled.     April 20, 2003 Component Ref Range & Units (hover) 8 mo ago  H pylori Ag, Stl Negative    EGD Feb 12, 2023 - Normal esophagus.  - Normal  stomach. Biopsied.  - Normal examined duodenum.  - No endoscopic abnormalities to explain iron deficiency  Path MARKED CHRONIC, FOCALLY ACTIVE GASTRITIS WITH LYMPHOID AGGREGATES HELICOBACTER PRESENT (I[facility], ADEQUATE CONTROL) NEGATIVE FOR INTESTINAL METAPLASIA, DYSPLASIA AND CARCINOMA   Colonoscopy Feb 12, 2023 - The entire examined colon is normal.  - Non-bleeding internal hemorrhoids.  - No specimens collected.   RUQUS IMPRESSION: 1. Increased hepatic parenchymal echogenicity suggestive of steatosis. 2. No cholelithiasis or sonographic evidence for acute cholecystitis    Past Medical History:  Diagnosis Date   Arthritis of left acromioclavicular joint    Environmental allergies    GERD (gastroesophageal reflux disease)    Hemorrhoids    History of 2019 novel coronavirus disease (COVID-19) 09/2019  and 01/ 2022   per pt both times mild symptoms that resolved,  pt does not have results for 01/ 2022   Left shoulder pain    OSA on CPAP    followed by dr Frances Furbish--- studay in epic 03-06-2019, moderate osa,, pt stated uses 5 days per week     Past Surgical History:  Procedure Laterality Date   ROTATOR CUFF REPAIR Right 2010   SHOULDER ARTHROSCOPY Left 12/2020   Family History  Problem Relation Age of Onset   Alcohol abuse Father        GF   Benign prostatic hyperplasia Other        auncle    Diabetes Neg Hx    Colon cancer Neg Hx    Esophageal  cancer Neg Hx    Liver disease Neg Hx    Prostate cancer Neg Hx    Social History   Tobacco Use   Smoking status: Never   Smokeless tobacco: Never  Vaping Use   Vaping status: Never Used  Substance Use Topics   Alcohol use: Not Currently    Comment: socially    Drug use: Never   Current Outpatient Medications  Medication Sig Dispense Refill   azithromycin (ZITHROMAX Z-PAK) 250 MG tablet As directed 6 each 0   Bismuth 262 MG CHEW Chew 524 mg by mouth in the morning, at noon, in the evening, and at bedtime. 112 tablet 0    Ferrous Fumarate (HEMOCYTE - 106 MG FE) 324 (106 Fe) MG TABS tablet Take 1 tablet (106 mg of iron total) by mouth daily. 90 tablet 1   hydrocortisone (ANUSOL-HC) 25 MG suppository Place 1 suppository (25 mg total) rectally daily. 12 suppository 1   hydrocortisone (ANUSOL-HC) 25 MG suppository Place 1 suppository (25 mg total) rectally 2 (two) times daily. 12 suppository 0   omeprazole (PRILOSEC) 20 MG capsule Take 1 capsule (20 mg total) by mouth 2 (two) times daily before a meal. 180 capsule 3   promethazine-dextromethorphan (PROMETHAZINE-DM) 6.25-15 MG/5ML syrup Take 5 mLs by mouth 4 (four) times daily as needed. 118 mL 0   No current facility-administered medications for this visit.   Allergies  Allergen Reactions   Other     Belt buckles, watch band and necklaces break with a rash, nickel?     Review of Systems: All systems reviewed and negative except where noted in HPI.    No results found.  Physical Exam: BP 130/80   Pulse 81   Ht 5\' 5"  (1.651 m)   Wt 213 lb (96.6 kg)   BMI 35.45 kg/m  Constitutional: Pleasant,well-developed, Hispanic male in no acute distress. HEENT: Normocephalic and atraumatic. Conjunctivae are normal. No scleral icterus. Neck supple.  Cardiovascular: Normal rate, regular rhythm.  Pulmonary/chest: Effort normal and breath sounds normal. No wheezing, rales or rhonchi. Abdominal: Soft, nondistended, nontender. Bowel sounds active throughout. There are no masses palpable. No hepatomegaly. Extremities: no edema Neurological: Alert and oriented to person place and time. Skin: Skin is warm and dry. No rashes noted. Psychiatric: Normal mood and affect. Behavior is normal.  CBC    Component Value Date/Time   WBC 5.1 12/09/2023 1121   RBC 4.52 12/09/2023 1121   HGB 9.0 (L) 12/09/2023 1121   HCT 29.8 (L) 12/09/2023 1121   PLT 372.0 12/09/2023 1121   MCV 65.9 Repeated and verified X2. (L) 12/09/2023 1121   MCH 27.7 02/12/2023 1700   MCHC 30.1  12/09/2023 1121   RDW 18.5 (H) 12/09/2023 1121   LYMPHSABS 2.0 12/09/2023 1121   MONOABS 0.4 12/09/2023 1121   EOSABS 0.3 12/09/2023 1121   BASOSABS 0.1 12/09/2023 1121    CMP     Component Value Date/Time   NA 138 12/09/2023 1121   K 4.5 12/09/2023 1121   CL 104 12/09/2023 1121   CO2 27 12/09/2023 1121   GLUCOSE 93 12/09/2023 1121   BUN 12 12/09/2023 1121   CREATININE 1.05 12/09/2023 1121   CALCIUM 9.8 12/09/2023 1121   PROT 7.9 12/09/2023 1121   ALBUMIN 4.8 12/09/2023 1121   AST 26 12/09/2023 1121   ALT 41 12/09/2023 1121   ALKPHOS 77 12/09/2023 1121   BILITOT 0.4 12/09/2023 1121   GFRNONAA 101.13 09/18/2010 0952       Latest  Ref Rng & Units 12/09/2023   11:21 AM 02/12/2023    5:00 PM 11/18/2022   11:46 AM  CBC EXTENDED  WBC 4.0 - 10.5 K/uL 5.1  6.1  5.9   RBC 4.22 - 5.81 Mil/uL 4.52  4.81  4.22   Hemoglobin 13.0 - 17.0 g/dL 9.0  40.9  81.1   HCT 91.4 - 52.0 % 29.8  40.5  35.7   Platelets 150.0 - 400.0 K/uL 372.0  325  386.0   NEUT# 1.4 - 7.7 K/uL 2.3   2.3   Lymph# 0.7 - 4.0 K/uL 2.0   2.7       ASSESSMENT AND PLAN:  Assessment and Plan    Iron Deficiency Anemia Recurrent iron deficiency anemia with hemoglobin decreased to 9 g/dL from 13 g/dL over the past year. Previous H. pylori infection treated, but anemia has recurred. Differential diagnosis includes recurrent H. pylori infection/vs false negative stool test, hemorrhoidal bleeding, or other gastrointestinal bleeding sources. Reports fatigue and low energy, which may be related to anemia. Hemorrhoids have been bleeding intermittently, but would not think consistently or profusely enough to account for the anemia.  If H. pylori is not the cause, further investigation with a pill camera study is planned to evaluate the small intestine for other bleeding sources. If no other sources are found, hemorrhoids may be considered the cause, and treatment options such as banding or hemorrhoidectomy will be discussed. -  Order H. pylori stool Diatherix test - Prescribe iron tablets to be taken daily for at least three months. - Recheck CBC and iron panel in three months. - If H. pylori test is negative, consider pill camera study to evaluate small intestine for other bleeding sources. - Discuss potential hemorrhoid banding or hemorrhoidectomy if hemorrhoids are determined to be the cause of anemia.  Hemorrhoids Reports intermittent bleeding from hemorrhoids, particularly after consuming spicy foods. Bleeding episodes last 15-20 days and can occur multiple times a day. Hemorrhoids are not currently bleeding significantly. Hemorrhoids are unlikely to be the sole cause of significant anemia unless bleeding is very frequent and severe. Discussed dietary modifications to avoid triggering hemorrhoid bleeding. If hemorrhoids are determined to be the cause of anemia, treatment options include banding or hemorrhoidectomy, with banding being less invasive but hemorrhoidectomy offering a more permanent solution. - Discuss dietary modifications to avoid triggering hemorrhoid bleeding. - Consider hemorrhoid banding or hemorrhoidectomy if hemorrhoids are determined to be the cause of anemia.  Gastroesophageal Reflux Disease (GERD) Reports occasional acid reflux symptoms, particularly when eating close to bedtime. Currently taking omeprazole once or twice daily with good control of symptoms. Advised to avoid eating 3-4 hours before bedtime to prevent reflux symptoms. Discussed that eating close to bedtime can exacerbate symptoms and that even small meals like cereal should be avoided before sleep. - Continue omeprazole as needed. - Advise to avoid eating 3-4 hours before bedtime.  Sleep Apnea Reports not using CPAP mask consistently and feeling tired despite adequate sleep, which may be related to untreated sleep apnea. Anemia also likely contributing to fatigue.  Advised to follow up with sleep specialist or primary care provider  to explore alternative treatments for sleep apnea. Discussed that untreated sleep apnea can exacerbate fatigue and that alternative treatments should be considered if CPAP is not effective. - Recommend follow-up with sleep specialist or primary care provider to address sleep apnea management.      Siani Utke E. Tomasa Rand, MD [facility]  I spent a total of 30 minutes reviewing the  patient's medical record, interviewing and examining the patient, discussing his diagnosis and management of his condition going forward, and documenting in the medical record  Zola Button, [place], *

## 2024-02-28 ENCOUNTER — Encounter: Payer: Self-pay | Admitting: Family Medicine

## 2024-02-28 ENCOUNTER — Ambulatory Visit: Admitting: Family Medicine

## 2024-02-28 VITALS — BP 126/70 | HR 94 | Temp 97.9°F | Resp 16 | Ht 65.0 in | Wt 217.0 lb

## 2024-02-28 DIAGNOSIS — M25511 Pain in right shoulder: Secondary | ICD-10-CM

## 2024-02-28 MED ORDER — CYCLOBENZAPRINE HCL 10 MG PO TABS: 10.0000 mg | ORAL_TABLET | Freq: Three times a day (TID) | ORAL | 0 refills | Status: DC | PRN

## 2024-02-28 MED ORDER — PREDNISONE 10 MG PO TABS: ORAL_TABLET | ORAL | 0 refills | Status: DC
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
## 2024-03-13 ENCOUNTER — Encounter: Payer: 59 | Admitting: Internal Medicine

## 2024-03-28 ENCOUNTER — Encounter: Payer: Self-pay | Admitting: Internal Medicine

## 2024-03-28 ENCOUNTER — Ambulatory Visit (INDEPENDENT_AMBULATORY_CARE_PROVIDER_SITE_OTHER): Admitting: Internal Medicine

## 2024-03-28 VITALS — BP 126/66 | HR 84 | Temp 97.5°F | Resp 16 | Ht 65.0 in | Wt 211.4 lb

## 2024-03-28 DIAGNOSIS — K649 Unspecified hemorrhoids: Secondary | ICD-10-CM | POA: Diagnosis not present

## 2024-03-28 DIAGNOSIS — G4733 Obstructive sleep apnea (adult) (pediatric): Secondary | ICD-10-CM

## 2024-03-28 DIAGNOSIS — Z0000 Encounter for general adult medical examination without abnormal findings: Secondary | ICD-10-CM | POA: Diagnosis not present

## 2024-03-28 DIAGNOSIS — D649 Anemia, unspecified: Secondary | ICD-10-CM | POA: Diagnosis not present

## 2024-03-28 DIAGNOSIS — Z0001 Encounter for general adult medical examination with abnormal findings: Secondary | ICD-10-CM

## 2024-03-28 DIAGNOSIS — R739 Hyperglycemia, unspecified: Secondary | ICD-10-CM | POA: Diagnosis not present

## 2024-03-28 DIAGNOSIS — D500 Iron deficiency anemia secondary to blood loss (chronic): Secondary | ICD-10-CM

## 2024-03-28 MED ORDER — HYDROCORTISONE ACETATE 25 MG RE SUPP: 25.0000 mg | Freq: Two times a day (BID) | RECTAL | 1 refills | Status: AC | PRN

## 2024-03-28 NOTE — Patient Instructions (Addendum)
 Continue taking iron away from the acid reducer.  Drink plenty of fluids  Eat more fruits and vegetables  Take Metamucil 1 capsule daily with your dinner  When you have bleeding from the hemorrhoids, use suppository twice a daily until better  Use your CPAP every night   Vaccines: Flu shot every fall Shingles vaccine COVID booster  GO TO THE LAB :  Get the blood work   Your results will be posted on MyChart with my comments  Next office visit for a checkup in 6 months Please make an appointment before you leave today

## 2024-03-28 NOTE — Progress Notes (Unsigned)
 Subjective:    Patient ID: Zachary Kerr, male    DOB: 1976/02/23, 48 y.o.   MRN: [id number]  DOS:  03/28/2024 Type of visit - description: CPX  Here for CPX. Chronic medical problems addressed. Since the last visit was noted to have IDA again. He denies taking NSAIDs. Saw GI, they recommend iron supplements which she is taking.  Denies nausea vomiting.  No diarrhea. The stools are well-formed, has 3-4 BMs daily. Still has episodic hemorrhoidal bleed.  Denies rectal pain or itching.   Review of Systems See above   Past Medical History:  Diagnosis Date   Arthritis of left acromioclavicular joint    Environmental allergies    GERD (gastroesophageal reflux disease)    Hemorrhoids    History of 2019 novel coronavirus disease (COVID-19) 09/2019  and 01/ 2022   per pt both times mild symptoms that resolved,  pt does not have results for 01/ 2022   Left shoulder pain    OSA on CPAP    followed by dr buck--- studay in epic 03-06-2019, moderate osa,, pt stated uses 5 days per week    Past Surgical History:  Procedure Laterality Date   ROTATOR CUFF REPAIR Right 2010   SHOULDER ARTHROSCOPY Left 12/2020    Current Outpatient Medications  Medication Instructions   cyclobenzaprine  (FLEXERIL ) 10 mg, Oral, 3 times daily PRN   ferrous sulfate  325 mg, Oral, Daily with breakfast   omeprazole  (PRILOSEC) 20 mg, Oral, 2 times daily before meals   predniSONE  (DELTASONE ) 10 MG tablet TAKE 3 TABLETS PO QD FOR 3 DAYS THEN TAKE 2 TABLETS PO QD FOR 3 DAYS THEN TAKE 1 TABLET PO QD FOR 3 DAYS THEN TAKE 1/2 TAB PO QD FOR 3 DAYS       Objective:   Physical Exam BP 126/66   Pulse 84   Temp (!) 97.5 F (36.4 C) (Oral)   Resp 16   Ht 5' 5 (1.651 m)   Wt 211 lb 6 oz (95.9 kg)   SpO2 98%   BMI 35.17 kg/m  General: Well developed, NAD, BMI noted Neck: No  thyromegaly  HEENT:  Normocephalic . Face symmetric, atraumatic Lungs:  CTA B Normal respiratory effort, no intercostal retractions,  no accessory muscle use. Heart: RRR,  no murmur.  Abdomen:  Not distended, soft, non-tender. No rebound or rigidity.   Lower extremities: no pretibial edema bilaterally  Skin: Exposed areas without rash. Not pale. Not jaundice Neurologic:  alert & oriented X3.  Speech normal, gait appropriate for age and unassisted Strength symmetric and appropriate for age.  Psych: Cognition and judgment appear intact.  Cooperative with normal attention span and concentration.  Behavior appropriate. No anxious or depressed appearing.     Assessment   ASSESSMENT GERD OSA DX 2020 Internal hemorrhoids.  Large.  Colonoscopy 01/2023 Increased LFTs, denies Tylenol  or EtOH - Hep B, hep C serology negative (2020) - Saw GI 2024: Ultrasound-hepatic steatosis, multiple labs negative. Helicobacter pylori 01-2023 per EGD.  PLAN Here for CPX - Td 2022 - Vaccines I recommend: shingrix, Covid vax and flu shot  (fall) -CCS: h/o  IDA, C-scope 01/2023, next 10 years.   See comments under IDA - Prostate cancer screening: No symptoms, no FH, check PSA -Diet and exercise: Recommend to stay active. -Labs reviewed, will check FLP CBC anemia panel A1c PSA   IDA: Had recurrent IDA, hemoglobin dropped from 13 to 9.0. Saw GI 01/03/2024. They recommended H. pylori test, start iron, recheck CBC  and iron panel approximately 03-2024. They are considering a capsule endoscopy and possibly hemorrhoid banding banding. At this point, he denies nausea vomiting or diarrhea.  Still has episodic hemorrhoidal bleeding without pain or itching. Plan: Check a CBC and iron panel and since we are drawing blood today Continue iron on an empty stomach away from PPIs Return the stool test to document H. pylori eradication. Hemorrhoids: Recommend good hydration, increase fruits and vegetables in diet, Metamucil, suppositories as needed. OSA: Still complain of some fatigue, not using OSA every night CPAP every night, encouraged better  compliance. RTC 6 months OSA:    ==== OSA: Good CPAP compliance. IDA, increased LFTs: Saw GI, liver ultrasound: Steatosis.  Multiple labs done for increased LFTs and they were all okay.  Immune to hep A&B due to previous vaccines.  Was recommend diet and exercise for steatosis. Had a EGD and a colonoscopy, + Helicobacter.  Finishing treatment. Internal hemorrhoids: Minimal symptom on for the last 2 weeks.  Next steps per GI. RTC 1 year.

## 2024-03-29 ENCOUNTER — Encounter: Payer: Self-pay | Admitting: Internal Medicine

## 2024-03-29 LAB — LIPID PANEL
Cholesterol: 176 mg/dL (ref 0–200)
HDL: 50.7 mg/dL (ref >39.00)
LDL Cholesterol: 88 mg/dL (ref 0–99)
NonHDL: 125.32
Total CHOL/HDL Ratio: 3
Triglycerides: 188 mg/dL — ABNORMAL HIGH (ref 0.0–149.0)
VLDL: 37.6 mg/dL (ref 0.0–40.0)

## 2024-03-29 LAB — HEMOGLOBIN A1C: Hgb A1c MFr Bld: 5.8 % (ref 4.6–6.5)

## 2024-03-29 LAB — CBC WITH DIFFERENTIAL/PLATELET
Basophils Absolute: 0 10*3/uL (ref 0.0–0.1)
Basophils Relative: 1.1 % (ref 0.0–3.0)
Eosinophils Absolute: 0.2 10*3/uL (ref 0.0–0.7)
Eosinophils Relative: 4.8 % (ref 0.0–5.0)
HCT: 28.1 % — ABNORMAL LOW (ref 39.0–52.0)
Hemoglobin: 8.5 g/dL — ABNORMAL LOW (ref 13.0–17.0)
Lymphocytes Relative: 37.8 % (ref 12.0–46.0)
Lymphs Abs: 1.7 10*3/uL (ref 0.7–4.0)
MCHC: 30.2 g/dL (ref 30.0–36.0)
MCV: 65.4 fl — ABNORMAL LOW (ref 78.0–100.0)
Monocytes Absolute: 0.6 10*3/uL (ref 0.1–1.0)
Monocytes Relative: 13.8 % — ABNORMAL HIGH (ref 3.0–12.0)
Neutro Abs: 1.9 10*3/uL (ref 1.4–7.7)
Neutrophils Relative %: 42.5 % — ABNORMAL LOW (ref 43.0–77.0)
Platelets: 398 10*3/uL (ref 150.0–400.0)
RBC: 4.29 Mil/uL (ref 4.22–5.81)
RDW: 22.9 % — ABNORMAL HIGH (ref 11.5–15.5)
WBC: 4.5 10*3/uL (ref 4.0–10.5)

## 2024-03-29 LAB — IBC + FERRITIN
Ferritin: 6.7 ng/mL — ABNORMAL LOW (ref 22.0–322.0)
Iron: 18 ug/dL — ABNORMAL LOW (ref 42–165)
Saturation Ratios: 3.3 % — ABNORMAL LOW (ref 20.0–50.0)
TIBC: 547.4 ug/dL — ABNORMAL HIGH (ref 250.0–450.0)
Transferrin: 391 mg/dL — ABNORMAL HIGH (ref 212.0–360.0)

## 2024-03-29 LAB — PSA: PSA: 1.43 ng/mL (ref 0.10–4.00)

## 2024-03-29 NOTE — Assessment & Plan Note (Signed)
 Here for CPX - Td 2022 - Vaccines I recommend: shingrix, Covid vax and flu shot  (fall) -CCS: h/o  IDA, C-scope 01/2023, next 10 years.   See comments under IDA - Prostate cancer screening: No symptoms, no FH, check PSA -Diet and exercise: Recommend to stay active. -Labs reviewed, will check FLP CBC anemia panel A1c PSA

## 2024-03-29 NOTE — Assessment & Plan Note (Signed)
 Here for CPX   Other issues addressed today IDA: Had recurrent IDA, hemoglobin dropped from 13 to 9.0. Saw GI 01/03/2024. They recommended H. pylori test, start iron, recheck CBC and iron panel approximately 03-2024. They are considering a capsule endoscopy and possibly hemorrhoid banding banding. At this point, he denies nausea vomiting or diarrhea.  Still has episodic hemorrhoidal bleeding without pain or itching. Plan: Check a CBC and iron panel and since we are drawing blood today Continue iron on an empty stomach away from PPIs Return the stool test to document H. pylori eradication. Hemorrhoids: Recommend   increase fruits and vegetables in diet, Metamucil, suppositories as needed, Rx sent. OSA: Still complain of some fatigue, not using OSA every night CPAP every night, encouraged better compliance. RTC 6 months

## 2024-03-30 ENCOUNTER — Ambulatory Visit: Payer: Self-pay | Admitting: Internal Medicine

## 2024-04-07 ENCOUNTER — Telehealth: Payer: Self-pay | Admitting: *Deleted

## 2024-04-07 DIAGNOSIS — D509 Iron deficiency anemia, unspecified: Secondary | ICD-10-CM

## 2024-04-07 DIAGNOSIS — A048 Other specified bacterial intestinal infections: Secondary | ICD-10-CM

## 2024-04-07 NOTE — Telephone Encounter (Signed)
 I have spoken to patient to advise that Dr Stacia was contacted by Dr Amon regarding recent blood test showing continuing iron deficiency anemia. Dr Stacia has requested he come to [facility] 3rd floor front desk to pick up a stool kit to confirm that he no longer has H Pylori bacteria that could be the cause of this continued anemia. He is asked to return the kit to the basement floor lab. Patient [place] that [place]24 but when he returns, he will pick up the kit.   Kit is placed at front desk for patient pick up.

## 2024-04-07 NOTE — Telephone Encounter (Signed)
 Order placed in [facility] for H Pylori [facility] testing. Kit placed at 3rd floor front desk for patient pick up.

## 2024-04-07 NOTE — Telephone Encounter (Signed)
-----   Message from Glendia FORBES Holt sent at 04/07/2024  1:08 PM EDT ----- Regarding: Need H. pylori [employer] test Team,  Please contact the patient and let him know we need to repeat stool testing to make sure the H.pylori is truly eradicated, given that his iron deficiency has not improved.   Please have him submit a [employer] stool test.  If the test is negative, I would like to proceed with a capsule endoscopy to make sure there is not another source of bleeding.  Thanks
